# Patient Record
Sex: Male | Born: 1970
Health system: Southern US, Community
[De-identification: ages and names within clinical notes are randomized; demographics above are authoritative.]

## PROBLEM LIST (undated history)

## (undated) DIAGNOSIS — A159 Respiratory tuberculosis unspecified: Secondary | ICD-10-CM

## (undated) DIAGNOSIS — B20 Human immunodeficiency virus [HIV] disease: Secondary | ICD-10-CM

## (undated) DIAGNOSIS — Z21 Asymptomatic human immunodeficiency virus [HIV] infection status: Secondary | ICD-10-CM

## (undated) HISTORY — PX: HERNIA REPAIR: SHX51

## (undated) HISTORY — PX: TONSILLECTOMY: SUR1361

## (undated) HISTORY — DX: Respiratory tuberculosis unspecified: A15.9

---

## 2016-02-18 ENCOUNTER — Encounter (HOSPITAL_COMMUNITY): Payer: Self-pay | Admitting: Emergency Medicine

## 2016-02-18 ENCOUNTER — Emergency Department (HOSPITAL_COMMUNITY)
Admission: EM | Admit: 2016-02-18 | Discharge: 2016-02-18 | Disposition: A | Discharge status: Home / Self Care | Payer: Self-pay | Attending: Emergency Medicine | Admitting: Emergency Medicine

## 2016-02-18 DIAGNOSIS — Z880 Allergy status to penicillin: Secondary | ICD-10-CM | POA: Insufficient documentation

## 2016-02-18 DIAGNOSIS — B079 Viral wart, unspecified: Secondary | ICD-10-CM | POA: Insufficient documentation

## 2016-02-18 DIAGNOSIS — F17200 Nicotine dependence, unspecified, uncomplicated: Secondary | ICD-10-CM | POA: Insufficient documentation

## 2016-02-18 DIAGNOSIS — L84 Corns and callosities: Secondary | ICD-10-CM | POA: Insufficient documentation

## 2016-02-18 DIAGNOSIS — M7989 Other specified soft tissue disorders: Secondary | ICD-10-CM | POA: Insufficient documentation

## 2016-02-18 MED ORDER — NAPROXEN 500 MG PO TABS
500.0000 mg | ORAL_TABLET | Freq: Once | ORAL | Status: AC
  Administered 2016-02-18: 500 mg via ORAL
  Filled 2016-02-18: qty 1

## 2016-02-18 MED ORDER — NAPROXEN 500 MG PO TABS: 500.0000 mg | ORAL_TABLET | Freq: Two times a day (BID) | ORAL | Status: DC

## 2016-02-18 NOTE — ED Notes (Signed)
Pt states he put some callous cream on his hand a few days ago. On his right pointer finger. Pt also has a tight ring on that finger. Pt's finger is now swollen and his ring will not come off his finger

## 2016-02-18 NOTE — ED Provider Notes (Signed)
CSN: [id number]     Arrival date & time 02/18/16  0055 History   By signing my name below, I, Arlan Organshley Leger, attest that this documentation has been prepared under the direction and in the presence of Alp Goldwater, MD.  Electronically Signed: Arlan OrganAshley Leger, ED Scribe. 02/18/2016. 3:55 AM.   Chief Complaint  Patient presents with  . Finger Injury   Patient is a 45 y.o. male presenting with hand pain. The history is provided by the patient. No language interpreter was used.  Hand Pain This is a new problem. The current episode started more than 1 week ago. The problem occurs constantly. The problem has been gradually improving. Pertinent negatives include no chest pain and no abdominal pain. Nothing aggravates the symptoms. Nothing relieves the symptoms. Treatments tried: cutting ring of right index finger. The treatment provided significant relief.    HPI Comments: Geralynn Rilehillip Mella, R hand dominent is a 45 y.o. male without any pertinent past medical history who presents to the Emergency Department complaining of constant, ongoing swelling to the R pointer finger x 3 months. Pt states he has had a gold ring on his finger since December of 2016 and has been unable to remove it. OTC callous cream applied to finger without any improvement. No recent fever or chills. No numbness, loss of sensation, or weakness to finger. Pt with known allergies to penicillins.  PCP: No primary care provider on file.    History reviewed. No pertinent past medical history. Past Surgical History  Procedure Laterality Date  . Tonsillectomy    . Hernia repair     No family history on file. Social History  Substance Use Topics  . Smoking status: Current Every Day Smoker -- 0.05 packs/day  . Smokeless tobacco: None  . Alcohol Use: Yes     Comment: 4/week    Review of Systems  Constitutional: Negative for fever and chills.  Cardiovascular: Negative for chest pain.  Gastrointestinal: Negative for nausea,  vomiting and abdominal pain.  Musculoskeletal: Positive for joint swelling and arthralgias.  Neurological: Negative for weakness and numbness.  Psychiatric/Behavioral: Negative for confusion.  All other systems reviewed and are negative.     Allergies  Penicillins  Home Medications   Prior to Admission medications   Not on File   Triage Vitals: BP 138/99 mmHg  Pulse 102  Temp(Src) 98 F (36.7 C) (Oral)  Resp 18  Ht 5\' 9"  (1.753 m)  Wt 185 lb (83.915 kg)  BMI 27.31 kg/m2  SpO2 99%   Physical Exam  Constitutional: He is oriented to person, place, and time. He appears well-developed and well-nourished.  HENT:  Head: Normocephalic and atraumatic.  Mouth/Throat: Oropharynx is clear and moist. No oropharyngeal exudate.  Eyes: EOM are normal.  Neck: Normal range of motion.  Cardiovascular: Normal rate, regular rhythm, normal heart sounds and intact distal pulses.   No murmur heard. Pulmonary/Chest: Effort normal and breath sounds normal. No respiratory distress. He has no wheezes. He has no rales.  Abdominal: Soft. Bowel sounds are normal. He exhibits no distension. There is no tenderness. There is no rebound and no guarding.  Musculoskeletal: Normal range of motion.       Right hand: He exhibits normal two-point discrimination, normal capillary refill, no deformity and no laceration. Normal sensation noted. Normal strength noted.  Callous and small wart noted to right PIP palmar aspect Capillary refill less than 2 seconds   Neurological: He is alert and oriented to person, place, and time.  R  finger is N/V intact  Skin: Skin is warm and dry.  Psychiatric: He has a normal mood and affect. Judgment normal.  Nursing note and vitals reviewed.   ED Course  Procedures (including critical care time)  DIAGNOSTIC STUDIES: Oxygen Saturation is 99% on RA, Normal by my interpretation.    COORDINATION OF CARE: 3:48 AM- Will give Naprosyn. Discussed treatment plan with pt at  bedside and pt agreed to plan.     Labs Review Labs Reviewed - No data to display  Imaging Review No results found. I have personally reviewed and evaluated these images and lab results as part of my medical decision-making.   EKG Interpretation None      MDM   Final diagnoses:  None   Ring cut off finger.  Do not use the "callous cream" on your finger.  NSAIDs ice and elevation to decrease swelling.  No signs of infection of the tissue.  Follow up with your PMD for ongoing care   I personally performed the services described in this documentation, which was scribed in my presence. The recorded information has been reviewed and is accurate.     Cy Blamer, MD 02/18/16 [phone]

## 2018-02-25 ENCOUNTER — Emergency Department (HOSPITAL_BASED_OUTPATIENT_CLINIC_OR_DEPARTMENT_OTHER): Payer: Self-pay

## 2018-02-25 ENCOUNTER — Encounter (HOSPITAL_BASED_OUTPATIENT_CLINIC_OR_DEPARTMENT_OTHER): Payer: Self-pay

## 2018-02-25 ENCOUNTER — Emergency Department (HOSPITAL_BASED_OUTPATIENT_CLINIC_OR_DEPARTMENT_OTHER)
Admission: EM | Admit: 2018-02-25 | Discharge: 2018-02-26 | Disposition: A | Discharge status: Home / Self Care | Payer: Self-pay | Attending: Emergency Medicine | Admitting: Emergency Medicine

## 2018-02-25 ENCOUNTER — Encounter (HOSPITAL_COMMUNITY): Payer: Self-pay

## 2018-02-25 ENCOUNTER — Other Ambulatory Visit: Payer: Self-pay

## 2018-02-25 ENCOUNTER — Emergency Department (HOSPITAL_COMMUNITY)
Admission: EM | Admit: 2018-02-25 | Discharge: 2018-02-25 | Disposition: A | Discharge status: Home / Self Care | Payer: Managed Care, Other (non HMO) | Attending: Emergency Medicine | Admitting: Emergency Medicine

## 2018-02-25 DIAGNOSIS — J111 Influenza due to unidentified influenza virus with other respiratory manifestations: Secondary | ICD-10-CM | POA: Insufficient documentation

## 2018-02-25 DIAGNOSIS — R112 Nausea with vomiting, unspecified: Secondary | ICD-10-CM

## 2018-02-25 DIAGNOSIS — Z830 Family history of human immunodeficiency virus [HIV] disease: Secondary | ICD-10-CM

## 2018-02-25 DIAGNOSIS — R6889 Other general symptoms and signs: Secondary | ICD-10-CM

## 2018-02-25 DIAGNOSIS — Z5321 Procedure and treatment not carried out due to patient leaving prior to being seen by health care provider: Secondary | ICD-10-CM | POA: Insufficient documentation

## 2018-02-25 DIAGNOSIS — M791 Myalgia, unspecified site: Secondary | ICD-10-CM | POA: Insufficient documentation

## 2018-02-25 DIAGNOSIS — J181 Lobar pneumonia, unspecified organism: Secondary | ICD-10-CM | POA: Insufficient documentation

## 2018-02-25 DIAGNOSIS — R1110 Vomiting, unspecified: Secondary | ICD-10-CM | POA: Insufficient documentation

## 2018-02-25 DIAGNOSIS — J189 Pneumonia, unspecified organism: Secondary | ICD-10-CM

## 2018-02-25 DIAGNOSIS — R0981 Nasal congestion: Secondary | ICD-10-CM | POA: Insufficient documentation

## 2018-02-25 HISTORY — DX: Human immunodeficiency virus (HIV) disease: B20

## 2018-02-25 HISTORY — DX: Asymptomatic human immunodeficiency virus (hiv) infection status: Z21

## 2018-02-25 LAB — URINALYSIS, ROUTINE W REFLEX MICROSCOPIC
BACTERIA UA: NONE SEEN
Bilirubin Urine: NEGATIVE
Glucose, UA: NEGATIVE mg/dL
Ketones, ur: NEGATIVE mg/dL
Leukocytes, UA: NEGATIVE
Nitrite: NEGATIVE
PH: 5 (ref 5.0–8.0)
Protein, ur: NEGATIVE mg/dL
SPECIFIC GRAVITY, URINE: 1.014 (ref 1.005–1.030)
SQUAMOUS EPITHELIAL / LPF: NONE SEEN

## 2018-02-25 LAB — COMPREHENSIVE METABOLIC PANEL
ALBUMIN: 3.6 g/dL (ref 3.5–5.0)
ALT: 31 U/L (ref 17–63)
AST: 34 U/L (ref 15–41)
Alkaline Phosphatase: 75 U/L (ref 38–126)
Anion gap: 10 (ref 5–15)
BILIRUBIN TOTAL: 0.2 mg/dL — AB (ref 0.3–1.2)
BUN: 7 mg/dL (ref 6–20)
CHLORIDE: 103 mmol/L (ref 101–111)
CO2: 20 mmol/L — ABNORMAL LOW (ref 22–32)
CREATININE: 0.7 mg/dL (ref 0.61–1.24)
Calcium: 8.6 mg/dL — ABNORMAL LOW (ref 8.9–10.3)
GFR calc Af Amer: >60 mL/min (ref >60)
GLUCOSE: 106 mg/dL — AB (ref 65–99)
Potassium: 3.9 mmol/L (ref 3.5–5.1)
Sodium: 133 mmol/L — ABNORMAL LOW (ref 135–145)
Total Protein: 9.3 g/dL — ABNORMAL HIGH (ref 6.5–8.1)

## 2018-02-25 LAB — CBC
HEMATOCRIT: 47.5 % (ref 39.0–52.0)
Hemoglobin: 16.2 g/dL (ref 13.0–17.0)
MCH: 31.3 pg (ref 26.0–34.0)
MCHC: 34.1 g/dL (ref 30.0–36.0)
MCV: 91.7 fL (ref 78.0–100.0)
PLATELETS: 142 10*3/uL — AB (ref 150–400)
RBC: 5.18 MIL/uL (ref 4.22–5.81)
RDW: 13.6 % (ref 11.5–15.5)
WBC: 12.7 10*3/uL — ABNORMAL HIGH (ref 4.0–10.5)

## 2018-02-25 LAB — LIPASE, BLOOD: Lipase: 30 U/L (ref 11–51)

## 2018-02-25 MED ORDER — IBUPROFEN 800 MG PO TABS: 800.0000 mg | ORAL_TABLET | Freq: Three times a day (TID) | ORAL | 0 refills | Status: DC | PRN

## 2018-02-25 MED ORDER — SODIUM CHLORIDE 0.9 % IV BOLUS
2000.0000 mL | Freq: Once | INTRAVENOUS | Status: AC
  Administered 2018-02-25: 2000 mL via INTRAVENOUS

## 2018-02-25 MED ORDER — ACETAMINOPHEN 500 MG PO TABS
1000.0000 mg | ORAL_TABLET | Freq: Once | ORAL | Status: AC
  Administered 2018-02-25: 1000 mg via ORAL
  Filled 2018-02-25: qty 2

## 2018-02-25 MED ORDER — OSELTAMIVIR PHOSPHATE 75 MG PO CAPS: 75.0000 mg | ORAL_CAPSULE | Freq: Two times a day (BID) | ORAL | 0 refills | Status: DC

## 2018-02-25 MED ORDER — ONDANSETRON 4 MG PO TBDP: 4.0000 mg | ORAL_TABLET | Freq: Three times a day (TID) | ORAL | 0 refills | Status: DC | PRN

## 2018-02-25 MED ORDER — ONDANSETRON HCL 4 MG/2ML IJ SOLN
4.0000 mg | Freq: Once | INTRAMUSCULAR | Status: AC
  Administered 2018-02-25: 4 mg via INTRAVENOUS
  Filled 2018-02-25: qty 2

## 2018-02-25 NOTE — Discharge Instructions (Addendum)
You have been seen in the Emergency Department (ED) today for pneumonia.  Please drink plenty of clear fluids (water, Gatorade, chicken broth, etc).  You may use Tylenol and/or Motrin according to label instructions.  You can alternate between the two without any side effects.   You need to establish care with an infectious disease doctor ASAP. I have placed an ambulatory referral in the medical record to assist with this appointment but you will need to call and confirm the appointment date/time.  Please follow up with your doctor as listed above.  Call your doctor or return to the Emergency Department (ED) if you are unable to tolerate fluids due to vomiting, have worsening trouble breathing, become extremely tired or difficult to awaken, or if you develop any other symptoms that concern you.

## 2018-02-25 NOTE — ED Triage Notes (Signed)
Pt c/o cough and congestion for three days with vomiting and fever that started today, last dose of any OTC medication was ibuprofen at 1100 and last episode of vomiting was at 2000

## 2018-02-25 NOTE — ED Triage Notes (Signed)
Patient c/o cough, congestion, body aches x3 days and vomiting today. Denies diarrhea.

## 2018-02-25 NOTE — ED Provider Notes (Signed)
Emergency Department Provider Note   I have reviewed the triage vital signs and the nursing notes.   HISTORY  Chief Complaint Flu-Like Symptoms   HPI Chad [name] is a 47 y.o. male with PMH of HIV not on treatment or under ID care to the emergency department with flulike illness over the past 3-4 days including sinus congestion, sore throat, and face pressure.  He has experienced some mild cough that is non-productive.  He has been taking Motrin over-the-counter and today tried a hot-toddy and shortly after began having nausea and vomiting.  He denies any sick contacts.  He has a primary care physician in [place] but cannot recall the last time he is seen in.  He states that his last CD4 counts were "good" and performed at [facility]. He is not currently following with ID locally and has never been on anti-retrovirals since his diagnosis in 2004.    Past Medical History:  Diagnosis Date  . HIV (human immunodeficiency virus infection) (HCC)     There are no active problems to display for this patient.   Past Surgical History:  Procedure Laterality Date  . HERNIA REPAIR    . TONSILLECTOMY        Allergies Penicillins  No family history on file.  Social History Social History   Tobacco Use  . Smoking status: Current Every Day Smoker    Packs/day: 0.05  Substance Use Topics  . Alcohol use: Yes    Comment: 4/week  . Drug use: Not on file    Review of Systems  Constitutional: Positive fever/chills Eyes: No visual changes. ENT: No sore throat. Cardiovascular: Denies chest pain. Respiratory: Denies shortness of breath. Positive cough.  Gastrointestinal: No abdominal pain. Positive nausea and vomiting.  No diarrhea.  No constipation. Genitourinary: Negative for dysuria. Musculoskeletal: Negative for back pain. Skin: Negative for rash. Neurological: Negative for focal weakness or numbness. Positive mild HA.   10-point ROS otherwise  negative.  ____________________________________________   PHYSICAL EXAM:  VITAL SIGNS: ED Triage Vitals  Enc Vitals Group     BP 02/25/18 2227 (!) 158/91     Pulse Rate 02/25/18 2227 (!) 122     Resp 02/25/18 2227 (!) 24     Temp 02/25/18 2227 99 F (37.2 C)     Temp Source 02/25/18 2227 Oral     SpO2 02/25/18 2227 95 %     Weight 02/25/18 2227 180 lb (81.6 kg)     Height 02/25/18 2227 5\' 9"  (1.753 m)     Pain Score 02/25/18 2231 8   Constitutional: Alert and oriented. Well appearing and in no acute distress. Eyes: Conjunctivae are normal.  Head: Atraumatic. Nose: No congestion/rhinnorhea. Mouth/Throat: Mucous membranes are slightly dry.  Neck: No stridor.  No meningeal signs.  Cardiovascular: Sinus tachycardia. Good peripheral circulation. Grossly normal heart sounds.   Respiratory: Normal respiratory effort.  No retractions. Lungs CTAB. Gastrointestinal: Soft and nontender. No distention.  Musculoskeletal: No lower extremity tenderness nor edema. No gross deformities of extremities. Neurologic:  Normal speech and language. No gross focal neurologic deficits are appreciated.  Skin:  Skin is warm, dry and intact. No rash noted.  ____________________________________________   LABS (all labs ordered are listed, but only abnormal results are displayed)  Labs Reviewed  T-HELPER CELLS (CD4) COUNT (NOT AT [facility]) - Abnormal; Notable for the following components:      Result Value   CD4 % Helper T Cell 29 (*)    All other components within  normal limits  CULTURE, BLOOD (ROUTINE X 2)  CULTURE, BLOOD (ROUTINE X 2)   ____________________________________________  RADIOLOGY  Dg Chest 2 View  Result Date: 02/25/2018 CLINICAL DATA:  Flu like symptoms EXAM: CHEST - 2 VIEW COMPARISON:  None. FINDINGS: Hyperinflation. No focal consolidation or pleural effusion. Coarse perihilar interstitial opacity. Normal heart size. No pneumothorax. Slightly stellate configuration of the left  hilus. IMPRESSION: No focal pulmonary infiltrate. Coarse perihilar interstitial opacity, possible viral process. Somewhat stellate appearance to the left hilus, consider contrast-enhanced chest CT to exclude a mass in the region. Electronically Signed   By: Jasmine PangKim  Fujinaga M.D.   On: 02/25/2018 23:44   Ct Chest W Contrast  Result Date: 02/26/2018 CLINICAL DATA:  47 year old male with cough and congestion. EXAM: CT CHEST WITH CONTRAST TECHNIQUE: Multidetector CT imaging of the chest was performed during intravenous contrast administration. CONTRAST:  80mL ISOVUE-300 IOPAMIDOL (ISOVUE-300) INJECTION 61% COMPARISON:  Chest radiograph dated 02/25/2018 FINDINGS: Cardiovascular: There is no cardiomegaly or pericardial effusion. The thoracic aorta is unremarkable. The origins of the great vessels of the aortic arch are patent. There is no CT evidence of central pulmonary embolism. Evaluation of the peripheral branches is limited due to suboptimal opacification. Mediastinum/Nodes: Mildly enlarged right hilar lymph node measures 11 mm short axis, reactive. The esophagus and the thyroid gland are grossly unremarkable. No mediastinal fluid collection. Lungs/Pleura: Patchy nodular and ground-glass densities involving the right lower lobe as well as clusters of nodular densities in the upper lobes most consistent with pneumonia. Clinical correlation is recommended. Linear subpleural atelectasis/scarring predominantly involving the upper lobes as well as small biapical subpleural blebs. There is mild bronchiectatic changes of the left lower lobe. There is no pleural effusion or pneumothorax. The central airways are patent. Upper Abdomen: No acute abnormality. Musculoskeletal: No chest wall abnormality. No acute or significant osseous findings. IMPRESSION: 1. No CT evidence of central pulmonary artery embolus. 2. Nodular densities predominantly in the upper lobes as well as areas of ground-glass nodularity in the right lower lobe  most consistent with pneumonia. Clinical correlation is recommended. 3. Chronic changes of mild bronchiectasis and subpleural scarring. Electronically Signed   By: Elgie CollardArash  Radparvar M.D.   On: 02/26/2018 00:53    ____________________________________________   PROCEDURES  Procedure(s) performed:   Procedures  None ____________________________________________   INITIAL IMPRESSION / ASSESSMENT AND PLAN / ED COURSE  Pertinent labs & imaging results that were available during my care of the patient were reviewed by me and considered in my medical decision making (see chart for details).  Patient with past medical history of HIV, not under ID treatment and with last CD4 in 2011, presents to the emergency department with flulike illness with fever today along with one episode of vomiting.  His upper respiratory tract infection symptoms have been present for 3-4 days and manage primarily with Motrin at home.  Patient is tachycardic here with tachypnea.  No hypoxemia.  Abdomen is soft and nontender.  Risk stratification in this HIV patient is difficult given his lack of recent follow-up recent CD4.  [facility] than left after a Hinata Diener wait in the waiting room.  He has a leukocytosis but no other significant findings on those labs.  I will add blood cultures and send a CD4 count although this will not be back during the patient's ED visit.  Plan for aggressive IV fluid hydration along with Zofran and PO challenge.  Plan to obtain chest x-ray  to rule out pneumonia.   Labs and CXR reviewed. CD4 count pending. ID referral replaced. CT chest pending. Care transferred to Dr. Judd Lien.  ____________________________________________  FINAL CLINICAL IMPRESSION(S) / ED DIAGNOSES  Final diagnoses:  Flu-like symptoms  Non-intractable vomiting with nausea, unspecified vomiting type  Community acquired pneumonia of right lower lobe of lung (HCC)  Family  history of human immunodeficiency virus (hiv) disease     MEDICATIONS GIVEN DURING THIS VISIT:  Medications  sodium chloride 0.9 % bolus 2,000 mL (0 mLs Intravenous Stopped 02/26/18 0132)  ondansetron (ZOFRAN) injection 4 mg (4 mg Intravenous Given 02/25/18 2307)  acetaminophen (TYLENOL) tablet 1,000 mg (1,000 mg Oral Given 02/25/18 2310)  iopamidol (ISOVUE-300) 61 % injection 80 mL (80 mLs Intravenous Contrast Given 02/26/18 0012)     NEW OUTPATIENT MEDICATIONS STARTED DURING THIS VISIT:  Discharge Medication List as of 02/26/2018  1:05 AM    START taking these medications   Details  ibuprofen (ADVIL,MOTRIN) 800 MG tablet Take 1 tablet (800 mg total) by mouth every 8 (eight) hours as needed., Starting Mon 02/25/2018, Print    levofloxacin (LEVAQUIN) 750 MG tablet Take 1 tablet (750 mg total) by mouth daily. X 7 days, Starting Tue 02/26/2018, Print    ondansetron (ZOFRAN ODT) 4 MG disintegrating tablet Take 1 tablet (4 mg total) by mouth every 8 (eight) hours as needed for nausea or vomiting., Starting Mon 02/25/2018, Print    oseltamivir (TAMIFLU) 75 MG capsule Take 1 capsule (75 mg total) by mouth every 12 (twelve) hours., Starting Mon 02/25/2018, Print        Note:  This document was prepared using Dragon voice recognition software and may include unintentional dictation errors.  Alona Bene, MD Emergency Medicine    Jaaron Oleson, Arlyss Repress, MD 02/26/18 [phone]

## 2018-02-26 LAB — T-HELPER CELLS (CD4) COUNT (NOT AT ARMC)
CD4 T CELL ABS: 1020 /uL (ref 400–2700)
CD4 T CELL HELPER: 29 % — AB (ref 33–55)

## 2018-02-26 MED ORDER — LEVOFLOXACIN 750 MG PO TABS: 750.0000 mg | ORAL_TABLET | Freq: Every day | ORAL | 0 refills | Status: DC

## 2018-02-26 MED ORDER — IOPAMIDOL (ISOVUE-300) INJECTION 61%
80.0000 mL | Freq: Once | INTRAVENOUS | Status: AC | PRN
  Administered 2018-02-26: 80 mL via INTRAVENOUS

## 2018-02-26 NOTE — ED Provider Notes (Signed)
Patient's care assumed from Dr. Jacqulyn BathLong at shift change.  Patient with history of untreated HIV disease presenting with complaints of cough and fever for the past several days.  His chest x-ray showed abnormalities that radiologist recommended a chest CT to better define.  This chest CT was obtained and does show areas of groundglass nodularity in the right lower lobe most consistent with pneumonia.  He will be treated with Levaquin.  He has blood cultures and CD4 count pending.  The patient has been lost to infectious disease follow-up for 7 years since relocating to the area.  He will be given the follow-up information for the ID clinic.   Chad [name], Chad Hillenburg, MD 02/26/18 [phone]

## 2018-03-03 LAB — CULTURE, BLOOD (ROUTINE X 2)
Culture: NO GROWTH
Culture: NO GROWTH
Special Requests: ADEQUATE
Special Requests: ADEQUATE

## 2018-03-04 ENCOUNTER — Other Ambulatory Visit: Payer: Self-pay

## 2018-03-04 ENCOUNTER — Ambulatory Visit: Payer: Self-pay

## 2018-03-04 DIAGNOSIS — B20 Human immunodeficiency virus [HIV] disease: Secondary | ICD-10-CM

## 2018-03-04 DIAGNOSIS — Z79899 Other long term (current) drug therapy: Secondary | ICD-10-CM

## 2018-03-04 DIAGNOSIS — Z113 Encounter for screening for infections with a predominantly sexual mode of transmission: Secondary | ICD-10-CM

## 2018-03-05 LAB — FLUORESCENT TREPONEMAL AB(FTA)-IGG-BLD: FLUORESCENT TREPONEMAL ABS: REACTIVE — AB

## 2018-03-05 LAB — CBC WITH DIFFERENTIAL/PLATELET
BASOS ABS: 38 {cells}/uL (ref 0–200)
BASOS PCT: 0.5 %
EOS ABS: 90 {cells}/uL (ref 15–500)
Eosinophils Relative: 1.2 %
HEMATOCRIT: 42.2 % (ref 38.5–50.0)
Hemoglobin: 14.5 g/dL (ref 13.2–17.1)
Lymphs Abs: 4800 cells/uL — ABNORMAL HIGH (ref 850–3900)
MCH: 30.4 pg (ref 27.0–33.0)
MCHC: 34.4 g/dL (ref 32.0–36.0)
MCV: 88.5 fL (ref 80.0–100.0)
MPV: 11.3 fL (ref 7.5–12.5)
Monocytes Relative: 11.8 %
NEUTROS ABS: 1688 {cells}/uL (ref 1500–7800)
Neutrophils Relative %: 22.5 %
Platelets: 213 10*3/uL (ref 140–400)
RBC: 4.77 10*6/uL (ref 4.20–5.80)
RDW: 12.2 % (ref 11.0–15.0)
Total Lymphocyte: 64 %
WBC: 7.5 10*3/uL (ref 3.8–10.8)
WBCMIX: 885 {cells}/uL (ref 200–950)

## 2018-03-05 LAB — URINALYSIS
Bilirubin Urine: NEGATIVE
GLUCOSE, UA: NEGATIVE
HGB URINE DIPSTICK: NEGATIVE
Ketones, ur: NEGATIVE
LEUKOCYTES UA: NEGATIVE
Nitrite: NEGATIVE
Specific Gravity, Urine: 1.018 (ref 1.001–1.03)
pH: 6 (ref 5.0–8.0)

## 2018-03-05 LAB — COMPLETE METABOLIC PANEL WITH GFR
AG RATIO: 0.9 (calc) — AB (ref 1.0–2.5)
ALT: 22 U/L (ref 9–46)
AST: 28 U/L (ref 10–40)
Albumin: 4 g/dL (ref 3.6–5.1)
Alkaline phosphatase (APISO): 68 U/L (ref 40–115)
BUN: 10 mg/dL (ref 7–25)
CALCIUM: 8.9 mg/dL (ref 8.6–10.3)
CO2: 22 mmol/L (ref 20–32)
CREATININE: 0.8 mg/dL (ref 0.60–1.35)
Chloride: 103 mmol/L (ref 98–110)
GFR, EST AFRICAN AMERICAN: 125 mL/min/{1.73_m2} (ref >=60)
GFR, EST NON AFRICAN AMERICAN: 108 mL/min/{1.73_m2} (ref >=60)
GLOBULIN: 4.4 g/dL — AB (ref 1.9–3.7)
Glucose, Bld: 89 mg/dL (ref 65–99)
Potassium: 4.1 mmol/L (ref 3.5–5.3)
Sodium: 135 mmol/L (ref 135–146)
Total Bilirubin: 0.3 mg/dL (ref 0.2–1.2)
Total Protein: 8.4 g/dL — ABNORMAL HIGH (ref 6.1–8.1)

## 2018-03-05 LAB — RPR: RPR Ser Ql: REACTIVE — AB

## 2018-03-05 LAB — HEPATITIS C ANTIBODY
HEP C AB: NONREACTIVE
SIGNAL TO CUT-OFF: 0.16 (ref <1.00)

## 2018-03-05 LAB — QUANTIFERON-TB GOLD PLUS
Mitogen-NIL: >10 IU/mL
NIL: 0.2 IU/mL
QUANTIFERON-TB GOLD PLUS: NEGATIVE
TB1-NIL: 0.14 [IU]/mL
TB2-NIL: 0.09 IU/mL

## 2018-03-05 LAB — LIPID PANEL
CHOL/HDL RATIO: 3.6 (calc) (ref <5.0)
CHOLESTEROL: 129 mg/dL (ref <200)
HDL: 36 mg/dL — ABNORMAL LOW (ref >40)
LDL CHOLESTEROL (CALC): 77 mg/dL
NON-HDL CHOLESTEROL (CALC): 93 mg/dL (ref <130)
Triglycerides: 77 mg/dL (ref <150)

## 2018-03-05 LAB — HEPATITIS B SURFACE ANTIGEN: Hepatitis B Surface Ag: NONREACTIVE

## 2018-03-05 LAB — HEPATITIS A ANTIBODY, TOTAL: HEPATITIS A AB,TOTAL: NONREACTIVE

## 2018-03-05 LAB — T-HELPER CELL (CD4) - (RCID CLINIC ONLY)
CD4 % Helper T Cell: 27 % — ABNORMAL LOW (ref 33–55)
CD4 T CELL ABS: 1370 /uL (ref 400–2700)

## 2018-03-05 LAB — HEPATITIS B SURFACE ANTIBODY,QUALITATIVE: HEP B S AB: REACTIVE — AB

## 2018-03-05 LAB — RPR TITER

## 2018-03-05 LAB — URINE CYTOLOGY ANCILLARY ONLY
Chlamydia: NEGATIVE
NEISSERIA GONORRHEA: NEGATIVE

## 2018-03-05 LAB — HEPATITIS B CORE ANTIBODY, TOTAL: HEP B C TOTAL AB: REACTIVE — AB

## 2018-03-07 LAB — HLA B*5701: HLA-B 5701 W/RFLX HLA-B HIGH: NEGATIVE

## 2018-03-15 LAB — HIV-1 RNA ULTRAQUANT REFLEX TO GENTYP+
HIV 1 RNA QUANT: 1490 {copies}/mL — AB
HIV-1 RNA Quant, Log: 3.17 Log cps/mL — ABNORMAL HIGH

## 2018-03-15 LAB — RFLX HIV-1 INTEGRASE GENOTYPE: HIV-1 GENOTYPE: DETECTED — AB

## 2018-03-21 ENCOUNTER — Encounter: Payer: Self-pay | Admitting: Family

## 2018-03-21 ENCOUNTER — Ambulatory Visit: Payer: Self-pay

## 2018-04-08 DIAGNOSIS — B20 Human immunodeficiency virus [HIV] disease: Secondary | ICD-10-CM | POA: Insufficient documentation

## 2018-04-08 NOTE — Progress Notes (Signed)
Subjective:    Patient ID: Chad [name], male    DOB: January 09, 1971, 47 y.o.   MRN: [id number]  Chief Complaint  Patient presents with  . Establish Care  . HIV Positive/AIDS     HPI:  Chad [name] is a 47 y.o. male who presents today to establish care for his HIV disease.  Chad [name] was first diagnosed with HIV in 2004 when he was being treated for syphilis.. He has never been on antiretroviral therapy since he was initially diagnosed. He was most recently seen in the ED with the chief complaint of cough and fever for several days. CT scan showed ground-glass nodularity consistent with pneumonia. He was treated with levofloxacin and referred to ID. Lab work showed that his CD4 count was 1370 with a viral load of 1490. His RPR was reactive with a titer of 1:4. His Hepaittis B surface antigen was negative as his Hepatitis C antibody.  Denies fevers, chills, night sweats, headaches, changes in vision, neck pain/stiffness, nausea, diarrhea, vomiting, lesions or rashes.   No Known Allergies    Outpatient Medications Prior to Visit  Medication Sig Dispense Refill  . ibuprofen (ADVIL,MOTRIN) 800 MG tablet Take 1 tablet (800 mg total) by mouth every 8 (eight) hours as needed. 21 tablet 0  . levofloxacin (LEVAQUIN) 750 MG tablet Take 1 tablet (750 mg total) by mouth daily. X 7 days 7 tablet 0  . ondansetron (ZOFRAN ODT) 4 MG disintegrating tablet Take 1 tablet (4 mg total) by mouth every 8 (eight) hours as needed for nausea or vomiting. (Patient not taking: Reported on 04/09/2018) 20 tablet 0  . oseltamivir (TAMIFLU) 75 MG capsule Take 1 capsule (75 mg total) by mouth every 12 (twelve) hours. 10 capsule 0   No facility-administered medications prior to visit.      Past Medical History:  Diagnosis Date  . HIV (human immunodeficiency virus infection) ([place])   . Tuberculosis       Past Surgical History:  Procedure Laterality Date  . HERNIA REPAIR    . TONSILLECTOMY         Family History  Problem Relation Age of Onset  . Throat cancer Mother   . Aneurysm Maternal Grandmother       Social History   Socioeconomic History  . Marital status: Single    Spouse name: Not on file  . Number of children: 1  . Years of education: 55  . Highest education level: Bachelor's degree (e.g., BA, AB, BS)  Occupational History  . Not on file  Social Needs  . Financial resource strain: Not on file  . Food insecurity:    Worry: Not on file    Inability: Not on file  . Transportation needs:    Medical: Not on file    Non-medical: Not on file  Tobacco Use  . Smoking status: Current Every Day Smoker    Packs/day: 0.05    Years: 20.00    Pack years: 1.00  . Smokeless tobacco: Never Used  Substance and Sexual Activity  . Alcohol use: Yes    Alcohol/week: 6.0 - 8.4 oz    Types: 10 - 14 Cans of beer per week  . Drug use: Yes    Frequency: 2.0 times per week    Types: Marijuana  . Sexual activity: Not on file  Lifestyle  . Physical activity:    Days per week: Not on file    Minutes per session: Not on file  . Stress: Not on  file  Relationships  . Social connections:    Talks on phone: Not on file    Gets together: Not on file    Attends religious service: Not on file    Active member of club or organization: Not on file    Attends meetings of clubs or organizations: Not on file    Relationship status: Not on file  . Intimate partner violence:    Fear of current or ex partner: Not on file    Emotionally abused: Not on file    Physically abused: Not on file    Forced sexual activity: Not on file  Other Topics Concern  . Not on file  Social History Narrative  . Not on file      Review of Systems  Constitutional: Negative for activity change, appetite change, diaphoresis, fatigue, fever and unexpected weight change.  HENT: Negative for congestion, sinus pressure and sore throat.   Respiratory: Negative for cough, chest tightness, shortness  of breath and wheezing.   Cardiovascular: Negative for chest pain and leg swelling.  Gastrointestinal: Negative for abdominal pain, constipation, diarrhea, nausea and vomiting.  Genitourinary: Negative for dysuria, flank pain, frequency, genital sores, hematuria and urgency.  Neurological: Negative for weakness and headaches.       Objective:    BP (!) 139/91 (BP Location: Left Arm, Patient Position: Sitting, Cuff Size: Normal)   Pulse 98   Temp 97.7 F (36.5 C) (Oral)   Resp 18   Ht _0  (1.727 m)   Wt 182 lb (82.6 kg)   SpO2 100%   BMI 27.67 kg/m  Nursing note and vital signs reviewed.  Physical Exam  Constitutional: He is oriented to person, place, and time. He appears well-developed. No distress.  HENT:  Mouth/Throat: Oropharynx is clear and moist.  Eyes: Conjunctivae are normal.  Neck: Neck supple.  Cardiovascular: Normal rate, regular rhythm, normal heart sounds and intact distal pulses. Exam reveals no gallop and no friction rub.  No murmur heard. Pulmonary/Chest: Effort normal and breath sounds normal. No respiratory distress. He has no wheezes. He has no rales. He exhibits no tenderness.  Abdominal: Soft. Bowel sounds are normal. There is no tenderness.  Lymphadenopathy:    He has no cervical adenopathy.  Neurological: He is alert and oriented to person, place, and time.  Skin: Skin is warm and dry. No rash noted.  Psychiatric: His behavior is normal. Judgment and thought content normal. His mood appears anxious.        Assessment & Plan:   Problem List Items Addressed This Visit      Other   HIV disease [facility])    [name] was diagnosed with HIV in 2004 and has not been on any medication regimens since initial diagnosis. We discussed the risks associated with untreated virus and he is wishing to pursue medication. The plan is to start [place]. He also had an opportunity to meet with our pharmacy staff to discuss. Prevnar updated today. Will need to check  [id number] at next office visit with blood work. Continue to monitor RPR titer of 1:4 with previous treatment and no current symptoms. Lipid profile completed with no medication needed currently. Condoms offered. Plan to follow up in 1 month or sooner if needed.       Relevant Medications   pneumococcal 13-valent conjugate vaccine (PREVNAR 13) injection 0.5 mL (Completed)       I have discontinued Doren Custard [name]'s ondansetron, oseltamivir, and levofloxacin. I am also having him maintain  his ibuprofen. We administered pneumococcal 13-valent conjugate vaccine.   Meds ordered this encounter  Medications  . pneumococcal 13-valent conjugate vaccine (PREVNAR 13) injection 0.5 mL     Follow-up: Return in about 1 month (around 05/10/2018), or if symptoms worsen or fail to improve.   Mauricio Po, [place] [facility]

## 2018-04-09 ENCOUNTER — Ambulatory Visit (INDEPENDENT_AMBULATORY_CARE_PROVIDER_SITE_OTHER): Payer: Self-pay | Admitting: Licensed Clinical Social Worker

## 2018-04-09 ENCOUNTER — Ambulatory Visit (INDEPENDENT_AMBULATORY_CARE_PROVIDER_SITE_OTHER): Payer: Self-pay | Admitting: Family

## 2018-04-09 ENCOUNTER — Encounter: Payer: Self-pay | Admitting: Family

## 2018-04-09 ENCOUNTER — Ambulatory Visit (INDEPENDENT_AMBULATORY_CARE_PROVIDER_SITE_OTHER): Payer: Self-pay | Admitting: Pharmacist

## 2018-04-09 DIAGNOSIS — B20 Human immunodeficiency virus [HIV] disease: Secondary | ICD-10-CM

## 2018-04-09 DIAGNOSIS — R110 Nausea: Secondary | ICD-10-CM

## 2018-04-09 DIAGNOSIS — F4320 Adjustment disorder, unspecified: Secondary | ICD-10-CM

## 2018-04-09 MED ORDER — PNEUMOCOCCAL 13-VAL CONJ VACC IM SUSP
0.5000 mL | Freq: Once | INTRAMUSCULAR | Status: AC
  Administered 2018-04-09: 0.5 mL via INTRAMUSCULAR

## 2018-04-09 MED ORDER — ONDANSETRON 4 MG PO TBDP: 4.0000 mg | ORAL_TABLET | Freq: Three times a day (TID) | ORAL | 0 refills | Status: DC | PRN

## 2018-04-09 MED ORDER — BICTEGRAVIR-EMTRICITAB-TENOFOV 50-200-25 MG PO TABS: 1.0000 | ORAL_TABLET | Freq: Every day | ORAL | 5 refills | Status: DC

## 2018-04-09 MED FILL — ONDANSETRON ODT 4 MG TABLET: 4 | 7 days supply | Qty: 20 | Fill #0

## 2018-04-09 MED FILL — BIKTARVY 50-200-25 MG TABS: 50-200-25 | 30 days supply | Qty: 30 | Fill #0

## 2018-04-09 NOTE — Progress Notes (Signed)
HPI: Chad [name] is a 47 y.o. male who presents to the clinic today to initiate care for his HIV infection.   Allergies: No Known Allergies  Vitals: Temp: 97.7 F (36.5 C) (05/14 0923) Temp Source: Oral (05/14 0923) BP: 139/91 (05/14 0923) Pulse Rate: 98 (05/14 1610)  Past Medical History: Past Medical History:  Diagnosis Date  . HIV (human immunodeficiency virus infection) (HCC)   . Tuberculosis     Social History: Social History   Socioeconomic History  . Marital status: Single    Spouse name: Not on file  . Number of children: 1  . Years of education: 8  . Highest education level: Bachelor's degree (e.g., BA, AB, BS)  Occupational History  . Not on file  Social Needs  . Financial resource strain: Not on file  . Food insecurity:    Worry: Not on file    Inability: Not on file  . Transportation needs:    Medical: Not on file    Non-medical: Not on file  Tobacco Use  . Smoking status: Current Every Day Smoker    Packs/day: 0.05    Years: 20.00    Pack years: 1.00  . Smokeless tobacco: Never Used  Substance and Sexual Activity  . Alcohol use: Yes    Alcohol/week: 6.0 - 8.4 oz    Types: 10 - 14 Cans of beer per week  . Drug use: Yes    Frequency: 2.0 times per week    Types: Marijuana  . Sexual activity: Not on file  Lifestyle  . Physical activity:    Days per week: Not on file    Minutes per session: Not on file  . Stress: Not on file  Relationships  . Social connections:    Talks on phone: Not on file    Gets together: Not on file    Attends religious service: Not on file    Active member of club or organization: Not on file    Attends meetings of clubs or organizations: Not on file    Relationship status: Not on file  Other Topics Concern  . Not on file  Social History Narrative  . Not on file    Labs: HIV 1 RNA Quant (Copies/mL)  Date Value  03/04/2018 1,490 (H)   CD4 T Cell Abs (/uL)  Date Value  03/04/2018 1,370  02/25/2018  1,020   Hep B S Ab (no units)  Date Value  03/04/2018 REACTIVE (A)   Hepatitis B Surface Ag (no units)  Date Value  03/04/2018 NON-REACTIVE    CrCl: CrCl cannot be calculated (Patient's most recent lab result is older than the maximum 21 days allowed.).  Lipids:    Component Value Date/Time   CHOL 129 03/04/2018 1043   TRIG 77 03/04/2018 1043   HDL 36 (L) 03/04/2018 1043   CHOLHDL 3.6 03/04/2018 1043   LDLCALC 77 03/04/2018 1043    Assessment: Chad [name] presents to see Chad [name] today to initiate treatment for his HIV infection. He has had HIV for about 15 years but has never started therapy. After discussion, he is ready to start medications today. He does have an HIV positive partner who is controlled on medications. They recently moved in together.   He will be started on Biktarvy. I counseled him on administration, side effects and the importance of adherence to the medication. Chad [name] was worried about potentially having nausea so we will send in a prescription for Zofran. He also takes a daily multivitamin in the  morning so I instructed him to take this medication at night.  He will be utilizing the [facility] for his medications.   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 We will get him set up with [employer] for now for his Biktarvy. He will bring in his pay stubs later today. His work does have a re-enrollment period in June and he plans to look into insurance options at that time. He will pick up his first month of Biktarvy today and take his first tablet tonight.   Recommendations: - Take Biktarvy every day -  Bring 2 pay-stubs from the last 90 days to the clinic  - F/U at the clinic in 1 month   Chad [name], PharmD, BCPS Clinical Infectious Disease Pharmacy Resident  [facility] 04/09/2018, 10:21 AM

## 2018-04-09 NOTE — Assessment & Plan Note (Signed)
Chad [name] was diagnosed with HIV in 2004 and has not been on any medication regimens since initial diagnosis. We discussed the risks associated with untreated virus and he is wishing to pursue medication. The plan is to start [place]. He also had an opportunity to meet with our pharmacy staff to discuss. Prevnar updated today. Will need to check [id number] at next office visit with blood work. Continue to monitor RPR titer of 1:4 with previous treatment and no current symptoms. Lipid profile completed with no medication needed currently. Condoms offered. Plan to follow up in 1 month or sooner if needed.

## 2018-04-09 NOTE — BH Specialist Note (Signed)
Integrated Behavioral Health Initial Visit  MRN: [id number] Name: Chad [name]  Number of Integrated Behavioral Health Clinician visits:: 1/6 Session Start time: 10:35am Session End time: 10:50am Total time: 15 minutes  Type of Service: Integrated Behavioral Health- Individual/Family Interpretor:No. Interpretor Name and Language: n/a   Warm Hand Off Completed.       SUBJECTIVE: Chad [name] is a 47 y.o. male accompanied by self Patient was referred by Marcos Eke for history of depression.  OBJECTIVE: Mood: Euthymic and Affect: Constricted Risk of harm to self or others: No plan to harm self or others  LIFE CONTEXT: Patient reports he moved here from [place] 2 years ago, has a good healthy relationship and is raising his daughter. When he moved he did not know where to seek treatment for HIV, and therefore was on no medications for a while. Recently he contracted the flu and was directed to follow up at [facility]. Currently he states that he is not experiencing depressive symptoms, but that he has dealt with depression in the past. 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    GOALS ADDRESSED: Patient will: 1. Increase knowledge and/or ability of: healthy habits and coping skills   INTERVENTIONS: Interventions utilized: Sleep Hygiene and Psychoeducation and/or Health Education   ASSESSMENT: Patient reports currently experiencing only the occasional situational sadness, such as missing his mother on mother's day. He reports past struggles with depressed mood but does not seem to arise to the diagnosis of major depression. Most appropriate diagnosis would be Adjustment Disorder as his struggles are reportedly situational. Counselor provided patient with information about counseling services and explored with him wellness skills. Counselor encouraged patient to develop a daily maintenance program to deal  with stressors.   Patient may benefit from counseling services as needed.  PLAN:  1. Behavioral recommendations: develop routine and wellness maintenance skills.    Angus Palms, LCSW

## 2018-04-09 NOTE — Patient Instructions (Signed)
Very nice to meet you!  We will get you started on Biktarvy.   You have received your Prevnar vaccination today. We will plan to get you the second pneumonia vaccination in about 3 months.  Please let us know if you have any questions or concerns.  We will see you back in 1 month.

## 2018-04-23 ENCOUNTER — Telehealth: Payer: Self-pay | Admitting: Pharmacist

## 2018-04-23 NOTE — Telephone Encounter (Signed)
Patient was supposed to send me his paystubs to get [place] approval for [name] until he gets insurance.  I have called patient at least 5-6 times to send them to me and he has yet to return my call or send them.

## 2018-04-29 ENCOUNTER — Encounter: Payer: Self-pay | Admitting: *Deleted

## 2018-04-30 ENCOUNTER — Telehealth: Payer: Self-pay | Admitting: Pharmacist

## 2018-04-30 NOTE — Telephone Encounter (Signed)
Awesome thank you!

## 2018-04-30 NOTE — Telephone Encounter (Signed)
Patient is approved to get [place] through [employer] patient assistance until 04/09/19.

## 2018-05-06 MED FILL — BIKTARVY 50-200-25 MG TABS: 50-200-25 | 30 days supply | Qty: 30 | Fill #1

## 2018-05-09 ENCOUNTER — Encounter: Payer: Self-pay | Admitting: Family

## 2018-05-09 ENCOUNTER — Ambulatory Visit (INDEPENDENT_AMBULATORY_CARE_PROVIDER_SITE_OTHER): Payer: Self-pay | Admitting: Family

## 2018-05-09 VITALS — BP 143/100 | HR 87 | Temp 97.9°F | Ht 69.0 in | Wt 187.0 lb

## 2018-05-09 DIAGNOSIS — B20 Human immunodeficiency virus [HIV] disease: Secondary | ICD-10-CM

## 2018-05-09 DIAGNOSIS — R030 Elevated blood-pressure reading, without diagnosis of hypertension: Secondary | ICD-10-CM

## 2018-05-09 DIAGNOSIS — Z23 Encounter for immunization: Secondary | ICD-10-CM

## 2018-05-09 NOTE — Assessment & Plan Note (Signed)
Blood pressure elevated today with no neurological or cardiovascular symptoms. Encouraged to monitor blood pressure at home and follow a low sodium diet. If blood pressure remains elevated will need to consider medication.

## 2018-05-09 NOTE — Assessment & Plan Note (Signed)
[name] appears to be improved since starting his medication and has been adherent to his medication regimen with no missed doses. He has no side effects. No signs/symptoms of opportunistic infection through history or physical exam. He is eating well. He has a good support system and continues to work at a stable job. Meningococcal updated today. Continue current dosage of Biktarvy. We will check his CD4 count and viral load. Plan to follow up in 3 months or sooner if needed.

## 2018-05-09 NOTE — Patient Instructions (Signed)
Nice to see you!  Keep taking your medication as prescribed.  We will recheck your viral load and CD4 count today.  Keep us posted about job changes - we can make it work.  Please ensure Cassie has your paystubs.

## 2018-05-09 NOTE — Progress Notes (Signed)
Subjective:    Patient ID: Chad [name], male    DOB: [birth year], 47 y.o.   MRN: [id number]  Chief Complaint  Patient presents with  . HIV Positive/AIDS    HPI:  Chad Rilehillip Deyarmin is a 47 y.o. male who presents today for routine follow up of HIV disease.  Mr. Roxan HockeyRobinson returns today having previously been seen in May 2019 and newly started on Biktarvy. He reports taking the medications as prescribed and denies adverse side effects. Has no problems obtaining or taking his medications. 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 Denies fevers, chills, night sweats, headaches, changes in vision, neck pain/stiffness, nausea, diarrhea, vomiting, lesions or rashes. Eating is improved. There is a chance he may be temporarily relocating to [place].    Allergies  Allergen Reactions  . Penicillins Other (See Comments)    Other Reaction: mild hives in remote past      Outpatient Medications Prior to Visit  Medication Sig Dispense Refill  . bictegravir-emtricitabine-tenofovir AF (BIKTARVY) 50-200-25 MG TABS tablet Take 1 tablet by mouth daily. 30 tablet 5  . ibuprofen (ADVIL,MOTRIN) 800 MG tablet Take 1 tablet (800 mg total) by mouth every 8 (eight) hours as needed. 21 tablet 0  . ondansetron (ZOFRAN ODT) 4 MG disintegrating tablet Take 1 tablet (4 mg total) by mouth every 8 (eight) hours as needed for nausea or vomiting. 20 tablet 0   No facility-administered medications prior to visit.      Past Medical History:  Diagnosis Date  . HIV (human immunodeficiency virus infection) (HCC)   . Tuberculosis      Past Surgical History:  Procedure Laterality Date  . HERNIA REPAIR    . TONSILLECTOMY         Review of Systems  Constitutional: Negative for activity change, appetite change, diaphoresis, fatigue, fever and unexpected weight change.  HENT: Negative for congestion, sinus pressure and sore throat.   Respiratory: Negative for cough, chest tightness, shortness  of breath and wheezing.   Cardiovascular: Negative for chest pain and leg swelling.  Gastrointestinal: Negative for abdominal pain, constipation, diarrhea, nausea and vomiting.  Genitourinary: Negative for dysuria, flank pain, frequency, genital sores, hematuria and urgency.  Neurological: Negative for weakness and headaches.      Objective:    BP (!) 143/100   Pulse 87   Temp 97.9 F (36.6 C) (Oral)   Ht 5\' 9"  (1.753 m)   Wt 187 lb (84.8 kg)   BMI 27.62 kg/m  Nursing note and vital signs reviewed.  Physical Exam  Constitutional: He is oriented to person, place, and time. He appears well-developed. No distress.  HENT:  Mouth/Throat: Oropharynx is clear and moist.  Eyes: Conjunctivae are normal.  Neck: Neck supple.  Cardiovascular: Normal rate, regular rhythm, normal heart sounds and intact distal pulses. Exam reveals no gallop and no friction rub.  No murmur heard. Pulmonary/Chest: Effort normal and breath sounds normal. No respiratory distress. He has no wheezes. He has no rales. He exhibits no tenderness.  Abdominal: Soft. Bowel sounds are normal. There is no tenderness.  Lymphadenopathy:    He has no cervical adenopathy.  Neurological: He is alert and oriented to person, place, and time.  Skin: Skin is warm and dry. No rash noted.  Psychiatric: He has a normal mood and affect. His behavior is normal. Judgment and thought content normal.       Assessment & Plan:   Problem List Items Addressed This Visit  Other   HIV disease (HCC) - Primary    Mr. Lanigan appears to be improved since starting his medication and has been adherent to his medication regimen with no missed doses. He has no side effects. No signs/symptoms of opportunistic infection through history or physical exam. He is eating well. He has a good support system and continues to work at a stable job. Meningococcal updated today. Continue current dosage of Biktarvy. We will check his CD4 count and viral  load. Plan to follow up in 3 months or sooner if needed.       Relevant Orders   T-helper cell (CD4)- ([facility] clinic only)   HIV 1 RNA quant-no reflex-bld   MENINGOCOCCAL MCV4O(MENVEO)   Elevated blood pressure reading    Blood pressure elevated today with no neurological or cardiovascular symptoms. Encouraged to monitor blood pressure at home and follow a low sodium diet. If blood pressure remains elevated will need to consider medication.           I am having Chad Rile maintain his ibuprofen, bictegravir-emtricitabine-tenofovir AF, and ondansetron.   Follow-up: Return in about 3 months (around 08/09/2018).   Marcos Eke, MSN, [facility]

## 2018-05-10 LAB — T-HELPER CELL (CD4) - (RCID CLINIC ONLY)
CD4 T CELL ABS: 1240 /uL (ref 400–2700)
CD4 T CELL HELPER: 28 % — AB (ref 33–55)

## 2018-05-14 LAB — HIV-1 RNA QUANT-NO REFLEX-BLD
HIV 1 RNA QUANT: DETECTED {copies}/mL — AB
HIV-1 RNA Quant, Log: <1.3 Log copies/mL — AB

## 2018-05-17 ENCOUNTER — Telehealth: Payer: Self-pay | Admitting: Behavioral Health

## 2018-05-17 NOTE — Telephone Encounter (Signed)
-----   Message from Veryl SpeakGregory D Calone, FNP sent at 05/17/2018  7:30 AM EDT ----- Please inform patient that his viral load is now undetectable and his CD4 count remains excellent at 1,240. Plan to follow up in 3 months as discussed.

## 2018-05-17 NOTE — Telephone Encounter (Signed)
-----   Message from Gregory D Calone, FNP sent at 05/17/2018  7:30 AM EDT ----- Please inform patient that his viral load is now undetectable and his CD4 count remains excellent at 1,240. Plan to follow up in 3 months as discussed. 

## 2018-05-17 NOTE — Telephone Encounter (Signed)
Called [name], informed him per Marcos EkeGreg Calone NP that his Viral Load is now undetectable and his CD count remains excellent at 1240.  Patient was appreciative and verbalized understanding.  Patient mentioned there is a possibility he may be moving  To [place] next year and we talked about what would need to be done to transfer care in the future. Angeline SlimAshley Davona Kinoshita RN

## 2018-06-10 MED FILL — BIKTARVY 50-200-25 MG TABS: 50-200-25 | 30 days supply | Qty: 30 | Fill #2

## 2018-07-08 MED FILL — BIKTARVY 50-200-25 MG TABS: 50-200-25 | 30 days supply | Qty: 30 | Fill #3

## 2018-07-10 ENCOUNTER — Telehealth: Payer: Self-pay | Admitting: Behavioral Health

## 2018-07-10 ENCOUNTER — Telehealth: Payer: Self-pay

## 2018-07-10 ENCOUNTER — Ambulatory Visit: Payer: Self-pay

## 2018-07-10 NOTE — Telephone Encounter (Signed)
-----   Message from Chad SpeakGregory D Calone, FNP sent at 07/10/2018 10:50 AM EDT ----- Yes Chad [name] is fine. It is longer than 8 weeks apart.   Thanks for checking! ----- Message ----- From: Chad [name], Chad Georg, Chad [name] Sent: 0/[id number]/14/2019   8:42 AM EDT To: Chad SpeakGregory D Calone, FNP  Chad [name] is coming in today for an injection.  It does not specify   It appears he is coming in for a second Chad [name] but there is not documentation to verify this.  Is this ok?

## 2018-07-10 NOTE — Telephone Encounter (Signed)
Called patient and left a generic message to call his doctor's office back at [phone].  Patient has a nurse visit today for a Menveo injection , the facility is currently waiting on the vaccines to arrive.  Was calling patient to reschedule his nurse visit. Angeline SlimAshley Angelisa Winthrop RN

## 2018-07-10 NOTE — Telephone Encounter (Signed)
Patient has an appointment coming up on 07/15/18. Verbal order per Marcos EkeGreg Calone NP to give Menvo 2 of 2.   Santa LighterShaquenia S, LPN

## 2018-07-15 ENCOUNTER — Other Ambulatory Visit: Payer: Self-pay

## 2018-07-15 DIAGNOSIS — B20 Human immunodeficiency virus [HIV] disease: Secondary | ICD-10-CM

## 2018-07-15 DIAGNOSIS — Z79899 Other long term (current) drug therapy: Secondary | ICD-10-CM

## 2018-07-17 ENCOUNTER — Other Ambulatory Visit: Payer: Self-pay

## 2018-07-17 DIAGNOSIS — Z79899 Other long term (current) drug therapy: Secondary | ICD-10-CM

## 2018-07-17 DIAGNOSIS — B20 Human immunodeficiency virus [HIV] disease: Secondary | ICD-10-CM

## 2018-07-18 LAB — T-HELPER CELL (CD4) - (RCID CLINIC ONLY)
CD4 T CELL HELPER: 30 % — AB (ref 33–55)
CD4 T Cell Abs: 1572 /uL (ref 400–2700)

## 2018-07-19 LAB — CBC WITH DIFFERENTIAL/PLATELET
BASOS ABS: 72 {cells}/uL (ref 0–200)
BASOS PCT: 0.9 %
EOS ABS: 168 {cells}/uL (ref 15–500)
Eosinophils Relative: 2.1 %
HCT: 45 % (ref 38.5–50.0)
HEMOGLOBIN: 15.4 g/dL (ref 13.2–17.1)
Lymphs Abs: 5408 cells/uL — ABNORMAL HIGH (ref 850–3900)
MCH: 31.6 pg (ref 27.0–33.0)
MCHC: 34.2 g/dL (ref 32.0–36.0)
MCV: 92.2 fL (ref 80.0–100.0)
MPV: 11 fL (ref 7.5–12.5)
Monocytes Relative: 7.2 %
NEUTROS ABS: 1776 {cells}/uL (ref 1500–7800)
Neutrophils Relative %: 22.2 %
Platelets: 198 10*3/uL (ref 140–400)
RBC: 4.88 10*6/uL (ref 4.20–5.80)
RDW: 13.1 % (ref 11.0–15.0)
TOTAL LYMPHOCYTE: 67.6 %
WBC: 8 10*3/uL (ref 3.8–10.8)
WBCMIX: 576 {cells}/uL (ref 200–950)

## 2018-07-19 LAB — LIPID PANEL
CHOL/HDL RATIO: 3.2 (calc) (ref <5.0)
CHOLESTEROL: 148 mg/dL (ref <200)
HDL: 46 mg/dL (ref >40)
LDL CHOLESTEROL (CALC): 69 mg/dL
Non-HDL Cholesterol (Calc): 102 mg/dL (calc) (ref <130)
Triglycerides: 249 mg/dL — ABNORMAL HIGH (ref <150)

## 2018-07-19 LAB — COMPLETE METABOLIC PANEL WITH GFR
AG Ratio: 0.8 (calc) — ABNORMAL LOW (ref 1.0–2.5)
ALBUMIN MSPROF: 3.7 g/dL (ref 3.6–5.1)
ALT: 23 U/L (ref 9–46)
AST: 36 U/L (ref 10–40)
Alkaline phosphatase (APISO): 71 U/L (ref 40–115)
BUN: 11 mg/dL (ref 7–25)
CO2: 24 mmol/L (ref 20–32)
Calcium: 9 mg/dL (ref 8.6–10.3)
Chloride: 103 mmol/L (ref 98–110)
Creat: 0.9 mg/dL (ref 0.60–1.35)
GFR, EST AFRICAN AMERICAN: 119 mL/min/{1.73_m2} (ref >=60)
GFR, EST NON AFRICAN AMERICAN: 103 mL/min/{1.73_m2} (ref >=60)
GLUCOSE: 111 mg/dL — AB (ref 65–99)
Globulin: 4.9 g/dL (calc) — ABNORMAL HIGH (ref 1.9–3.7)
Potassium: 4.1 mmol/L (ref 3.5–5.3)
Sodium: 136 mmol/L (ref 135–146)
TOTAL PROTEIN: 8.6 g/dL — AB (ref 6.1–8.1)
Total Bilirubin: 0.4 mg/dL (ref 0.2–1.2)

## 2018-07-19 LAB — HIV-1 RNA QUANT-NO REFLEX-BLD
HIV 1 RNA Quant: <20 copies/mL
HIV-1 RNA Quant, Log: <1.3 Log copies/mL
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
## 2018-07-30 ENCOUNTER — Encounter: Payer: Self-pay | Admitting: Family

## 2018-07-30 ENCOUNTER — Ambulatory Visit (INDEPENDENT_AMBULATORY_CARE_PROVIDER_SITE_OTHER): Payer: Self-pay | Admitting: Family

## 2018-07-30 VITALS — BP 163/107 | HR 90 | Temp 98.1°F | Ht 69.0 in | Wt 188.8 lb

## 2018-07-30 DIAGNOSIS — I10 Essential (primary) hypertension: Secondary | ICD-10-CM | POA: Insufficient documentation

## 2018-07-30 DIAGNOSIS — B20 Human immunodeficiency virus [HIV] disease: Secondary | ICD-10-CM

## 2018-07-30 DIAGNOSIS — Z23 Encounter for immunization: Secondary | ICD-10-CM

## 2018-07-30 MED ORDER — AMLODIPINE BESYLATE 10 MG PO TABS: 10.0000 mg | ORAL_TABLET | Freq: Every day | ORAL | 11 refills | Status: DC

## 2018-07-30 MED ORDER — BICTEGRAVIR-EMTRICITAB-TENOFOV 50-200-25 MG PO TABS: 1.0000 | ORAL_TABLET | Freq: Every day | ORAL | 5 refills | Status: DC

## 2018-07-30 NOTE — Patient Instructions (Signed)
Nice to see you.  Please continue to take your [place] as prescribed.  A prescription for amlodipine was sent to your pharmacy for your blood pressure.   Continue to monitor your blood pressure at home.  Plan for a nurse visit in 2 weeks to recheck your blood pressure.  Follow up office visit in 3 months with blood work 1-2 weeks prior to your appointment.

## 2018-07-30 NOTE — Assessment & Plan Note (Signed)
Chad [name] has well-controlled HIV disease with a viral load that is undetectable and CD4 count of 1500.  He is adherent to his Biktarvy with no adverse side effects.  He has no problems obtaining or taking his medications.  No signs/symptoms of opportunistic infection/progressive HIV disease through history or physical exam.  Influenza and second meningococcal vaccination given today.  Continue current dose of Biktarvy.  Follow-up office visit in 3 months or sooner if needed with blood work 1 to 2 weeks prior to appointment.

## 2018-07-30 NOTE — Assessment & Plan Note (Signed)
Chad [name] continues to have elevated blood pressure readings above 140/90 consistent with hypertension which is most likely exacerbated by his increased levels of stress the last 1 month.  Discussed importance of blood pressure control to reduce risk for heart failure, stroke, and cardiovascular disease in the future which are exponentially increased with HIV disease eating well controlled.  Start amlodipine.  Encouraged to monitor blood pressure at home and decrease carbohydrate intake.  He has no current symptoms of endorgan damage on physical exam.  Follow-up in 2 weeks for nurse visit to recheck blood pressure.

## 2018-08-13 MED FILL — BIKTARVY 50-200-25 MG TABS: 50-200-25 | 30 days supply | Qty: 30 | Fill #4

## 2018-08-23 IMAGING — CT CT CHEST W/ CM
2 of 4 series · 15 of 36 positions shown, 18 images · IV contrast (iopamidol)
Comparison: Chest radiograph dated 02/25/2018

CLINICAL DATA: 46-year-old male with cough and congestion.

EXAM:
CT CHEST WITH CONTRAST
TECHNIQUE: Multidetector CT imaging of the chest was performed during
intravenous contrast administration.
CONTRAST:  80mL [id number] IOPAMIDOL ([id number]) INJECTION 61%

[Series 2: axial st · axial · 0.78mm/px · z∈[-241,+27]mm · 12 of 160 slices shown, 15 images]
[im 13/160  mediastinal]
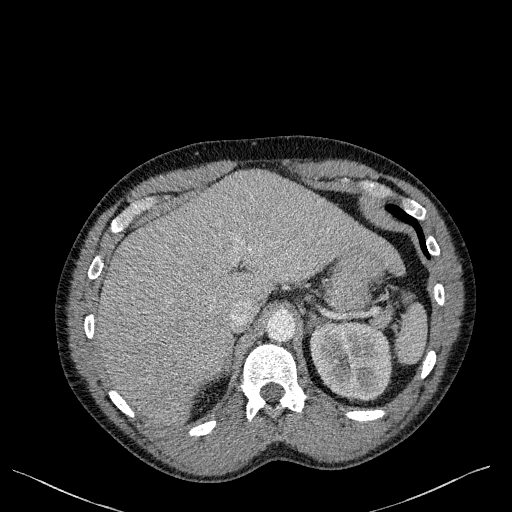
[im 13/160  lung]
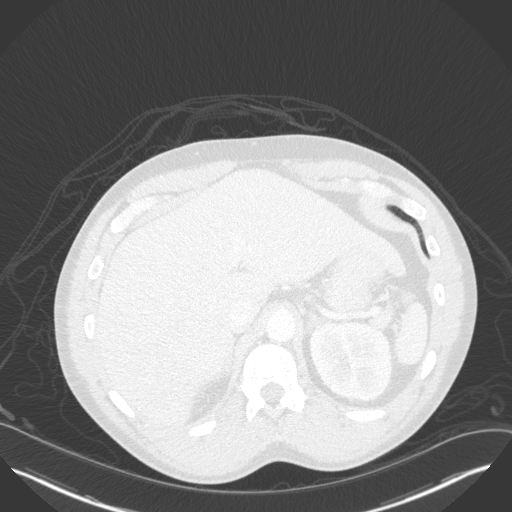
[im 25/160  lung]
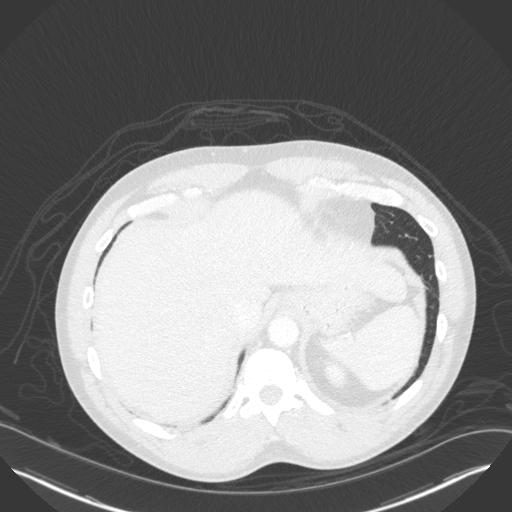
[im 37/160  lung]
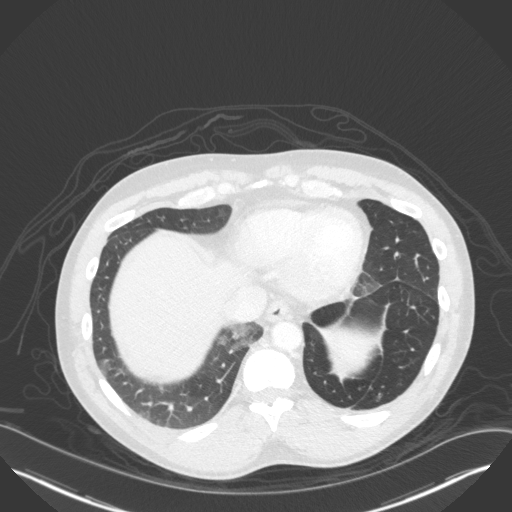
[im 49/160  lung]
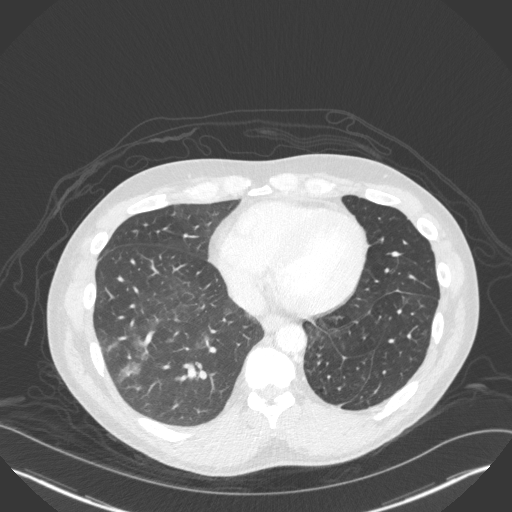
[im 62/160  mediastinal]
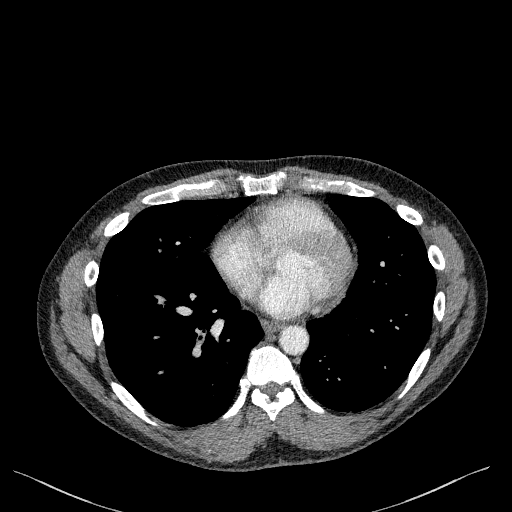
[im 62/160  lung]
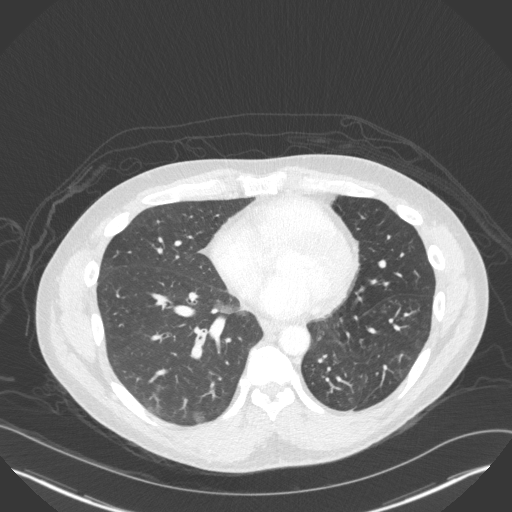
[im 74/160  lung]
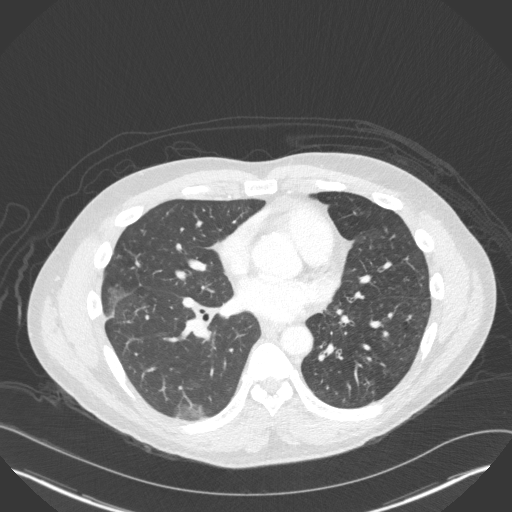
[im 86/160  lung]
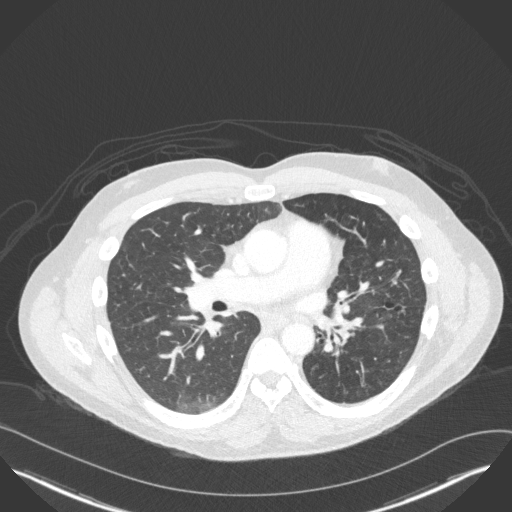
[im 98/160  lung]
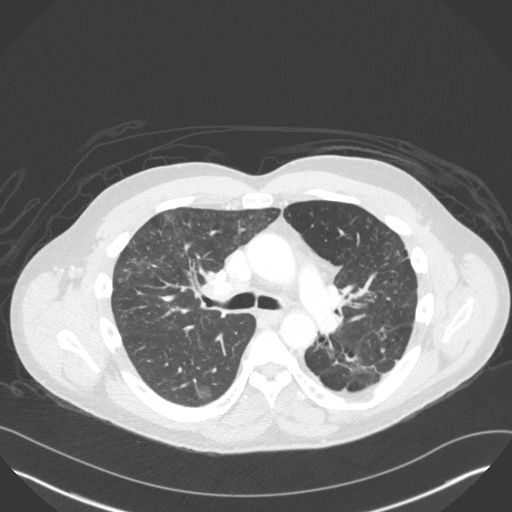
[im 111/160  mediastinal]
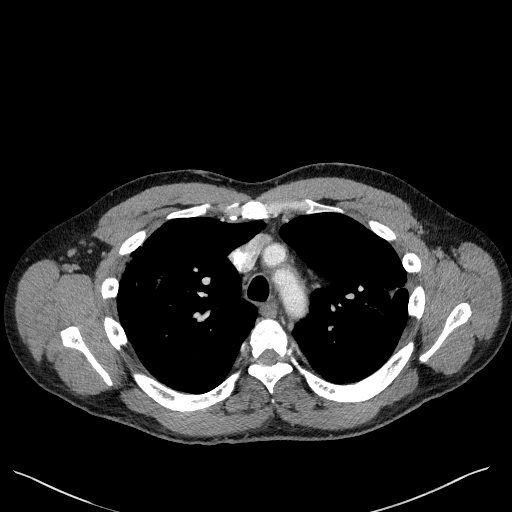
[im 111/160  lung]
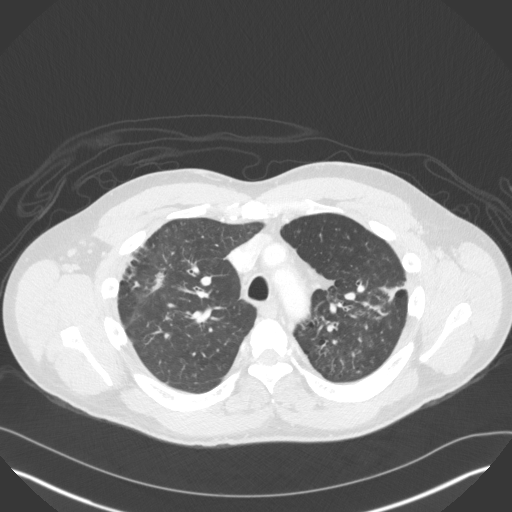
[im 123/160  lung]
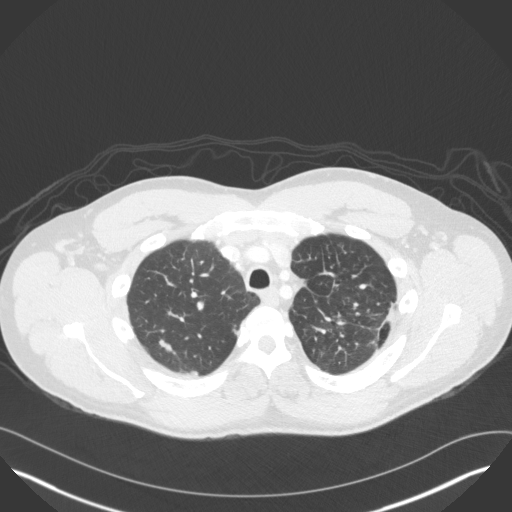
[im 135/160  lung]
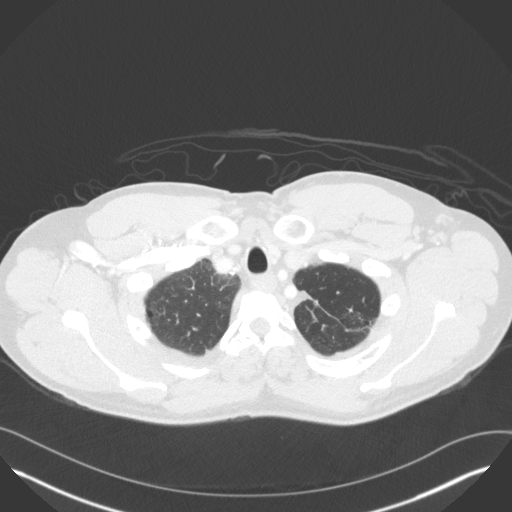
[im 147/160  lung]
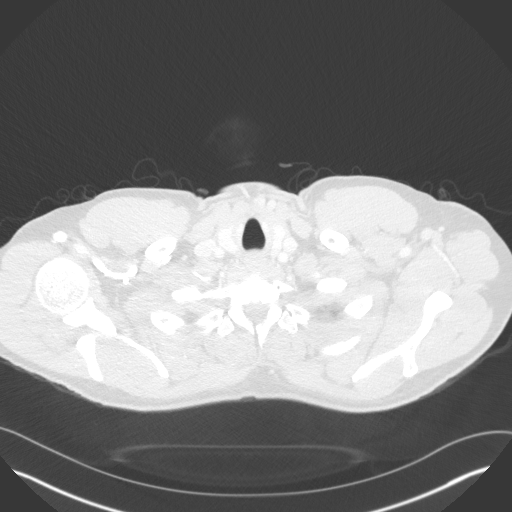

[Series 5: coronal · coronal · 0.73mm/px · 3 of 113 slices shown]
[im 23/113  lung]
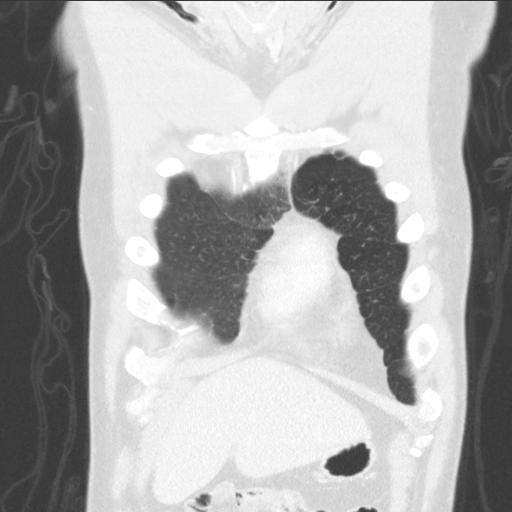
[im 45/113  lung]
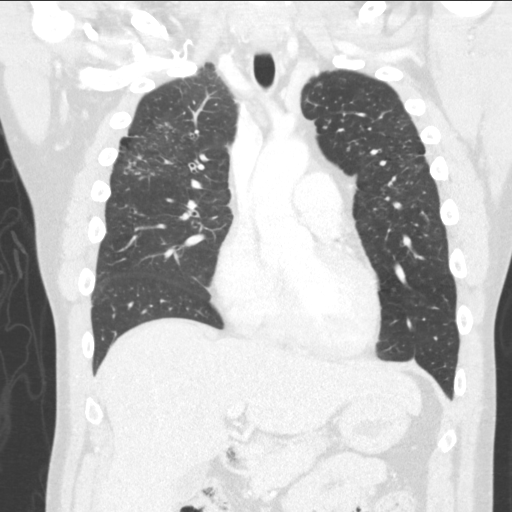
[im 68/113  lung]
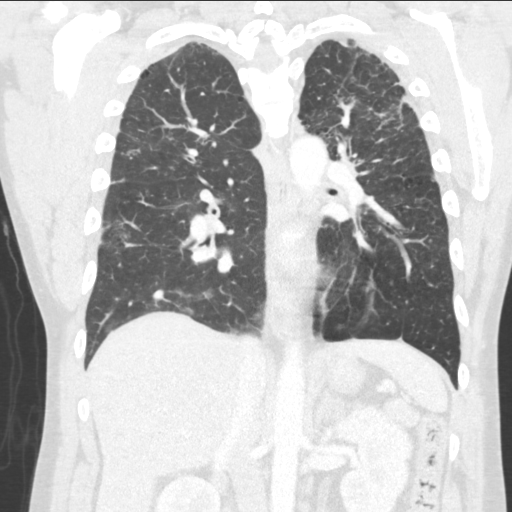

[15 of 36 positions shown; findings below may reference images not displayed]

FINDINGS: Cardiovascular: There is no cardiomegaly or pericardial effusion.
The thoracic aorta is unremarkable. The origins of the great vessels
of the aortic arch are patent. There is no CT evidence of central
pulmonary embolism. Evaluation of the peripheral branches is limited
due to suboptimal opacification.

Mediastinum/Nodes: Mildly enlarged right hilar lymph node measures
11 mm short axis, reactive. The esophagus and the thyroid gland are
grossly unremarkable. No mediastinal fluid collection.

Lungs/Pleura: Patchy nodular and ground-glass densities involving
the right lower lobe as well as clusters of nodular densities in the
upper lobes most consistent with pneumonia. Clinical correlation is
recommended. Linear subpleural atelectasis/scarring predominantly
involving the upper lobes as well as small biapical subpleural
blebs. There is mild bronchiectatic changes of the left lower lobe.
There is no pleural effusion or pneumothorax. The central airways
are patent.

Upper Abdomen: No acute abnormality.

Musculoskeletal: No chest wall abnormality. No acute or significant
osseous findings.
IMPRESSION: 1. No CT evidence of central pulmonary artery embolus.
2. Nodular densities predominantly in the upper lobes as well as
areas of ground-glass nodularity in the right lower lobe most
consistent with pneumonia. Clinical correlation is recommended.
3. Chronic changes of mild bronchiectasis and subpleural scarring.

## 2018-09-13 MED FILL — BIKTARVY 50-200-25 MG TABS: 50-200-25 | 30 days supply | Qty: 30 | Fill #5

## 2018-10-04 ENCOUNTER — Other Ambulatory Visit: Payer: Self-pay | Admitting: Pharmacist

## 2018-10-04 DIAGNOSIS — B20 Human immunodeficiency virus [HIV] disease: Secondary | ICD-10-CM

## 2018-10-10 ENCOUNTER — Other Ambulatory Visit: Payer: Self-pay

## 2018-10-10 DIAGNOSIS — B20 Human immunodeficiency virus [HIV] disease: Secondary | ICD-10-CM

## 2018-10-10 DIAGNOSIS — I10 Essential (primary) hypertension: Secondary | ICD-10-CM

## 2018-10-10 MED FILL — BIKTARVY 50-200-25 MG TABS: 50-200-25 | 30 days supply | Qty: 30 | Fill #0

## 2018-10-14 ENCOUNTER — Other Ambulatory Visit: Payer: Self-pay

## 2018-10-14 LAB — BASIC METABOLIC PANEL
BUN: 9 mg/dL (ref 7–25)
CALCIUM: 9 mg/dL (ref 8.6–10.3)
CO2: 26 mmol/L (ref 20–32)
Chloride: 102 mmol/L (ref 98–110)
Creat: 0.87 mg/dL (ref 0.60–1.35)
Glucose, Bld: 88 mg/dL (ref 65–99)
Potassium: 3.7 mmol/L (ref 3.5–5.3)
SODIUM: 136 mmol/L (ref 135–146)

## 2018-10-14 LAB — HIV-1 RNA QUANT-NO REFLEX-BLD
HIV 1 RNA QUANT: DETECTED {copies}/mL — AB
HIV-1 RNA Quant, Log: <1.3 Log copies/mL — AB

## 2018-10-28 ENCOUNTER — Ambulatory Visit (INDEPENDENT_AMBULATORY_CARE_PROVIDER_SITE_OTHER): Payer: Self-pay | Admitting: Family

## 2018-10-28 ENCOUNTER — Encounter: Payer: Self-pay | Admitting: Family

## 2018-10-28 VITALS — BP 131/87 | HR 89 | Temp 98.0°F | Ht 69.0 in | Wt 196.0 lb

## 2018-10-28 DIAGNOSIS — Z23 Encounter for immunization: Secondary | ICD-10-CM

## 2018-10-28 DIAGNOSIS — Z0000 Encounter for general adult medical examination without abnormal findings: Secondary | ICD-10-CM | POA: Insufficient documentation

## 2018-10-28 DIAGNOSIS — B20 Human immunodeficiency virus [HIV] disease: Secondary | ICD-10-CM

## 2018-10-28 DIAGNOSIS — Z720 Tobacco use: Secondary | ICD-10-CM | POA: Insufficient documentation

## 2018-10-28 DIAGNOSIS — I10 Essential (primary) hypertension: Secondary | ICD-10-CM

## 2018-10-28 NOTE — Assessment & Plan Note (Signed)
[name] has well controlled HIV disease with his current ART regimen of Biktarvy with good adherence and no adverse side effects. He has no problems obtaining his medications. No signs/symptoms of opportunistic or progressive HIV disease at present. Socially he is going through a lot of stressors between relationships and work. Declines meeting with our counselor today. Continue current dose of Biktarvy. Follow up office visit in 3 months or sooner if need with lab work 1-2 week prior to appointment.

## 2018-10-28 NOTE — Patient Instructions (Addendum)
Good to see you.  Keep up the great work you are getting healthy!  Continue to take your Biktarvy and amlodipine.   We will get you scheduled for our dentist.   We are here to help!  Plan for follow up in 3 months or sooner if needed with lab work 1-2 weeks prior to your appointment.  Have a healthy and happy holiday!!!

## 2018-10-28 NOTE — Assessment & Plan Note (Signed)
Continues to smoke about 1/2 pack per day. He is in the contemplation stage of quitting but socially and emotionally going through a lot of stressors right now. Discussed importance of having support resources available and being ready. Discussed the continued risk for cardiovascular, respiratory, or malignant disease in the future with continued tobacco and nicotine usage.

## 2018-10-28 NOTE — Progress Notes (Signed)
Subjective:    Patient ID: Chad [name], male    DOB: 16-Jun-1971, 47 y.o.   MRN: [id number]  Chief Complaint  Patient presents with  . HIV Positive/AIDS  . Hypertension     HPI:  Chad [name] is a 47 y.o. male who presents today for routine follow up of his HIV disease and hypertension.  1.) HIV disease - Chad [name] was last seen in the office on 07/30/18 for routine follow up and found to have a viral load that remained undetectable and CD4 count of 1500 with his regimen of Biktarvy. Most recent blood work completed on 10/10/18 shows a viral load that remains undetectable and kidney function and electrolytes within the normal ranges. Health maintenance due includes Pneumovax and dental screening.   Chad [name] has been taking his Biktarvy as prescribed with only 1 missed dose since his previous office visit. He has no adverse side effects and has no problems obtaining his medication. Socially he has been having a tough time as he is recently out of a relationship and is having some troubles at work. He is working his way through things and has a brother with whom he lives and is very supportive. He does continue to smoke about 1/2 pack per day.   Denies fevers, chills, night sweats, headaches, changes in vision, neck pain/stiffness, nausea, diarrhea, vomiting, lesions or rashes.   2.) Hypertension - Chad [name] has been taking his amlodipine as prescribed with no adverse side effects or lower extremity swelling. Does not currently take his blood pressure at home. Denies headaches, blurred or changed vision, chest pain or shortness of breath.   BP Readings from Last 3 Encounters:  10/28/18 131/87  07/30/18 (!) 163/107  05/09/18 (!) 143/100    Allergies  Allergen Reactions  . Penicillins Other (See Comments)    Other Reaction: mild hives in remote past      Outpatient Medications Prior to Visit  Medication Sig Dispense Refill  . amLODipine (NORVASC) 10 MG tablet  Take 1 tablet (10 mg total) by mouth daily. 30 tablet 11  . bictegravir-emtricitabine-tenofovir AF (BIKTARVY) 50-200-25 MG TABS tablet Take 1 tablet by mouth daily. 30 tablet 5  . BIKTARVY 50-200-25 MG TABS tablet TAKE 1 TABLET BY MOUTH DAILY. 30 tablet 5  . ibuprofen (ADVIL,MOTRIN) 800 MG tablet Take 1 tablet (800 mg total) by mouth every 8 (eight) hours as needed. 21 tablet 0  . ondansetron (ZOFRAN ODT) 4 MG disintegrating tablet Take 1 tablet (4 mg total) by mouth every 8 (eight) hours as needed for nausea or vomiting. 20 tablet 0   No facility-administered medications prior to visit.      Past Medical History:  Diagnosis Date  . HIV (human immunodeficiency virus infection) (HCC)   . Tuberculosis      Past Surgical History:  Procedure Laterality Date  . HERNIA REPAIR    . TONSILLECTOMY       Review of Systems  Constitutional: Negative for appetite change, chills, fatigue, fever and unexpected weight change.  Eyes: Negative for visual disturbance.  Respiratory: Negative for cough, chest tightness, shortness of breath and wheezing.   Cardiovascular: Negative for chest pain and leg swelling.  Gastrointestinal: Negative for abdominal pain, constipation, diarrhea, nausea and vomiting.  Genitourinary: Negative for dysuria, flank pain, frequency, genital sores, hematuria and urgency.  Skin: Negative for rash.  Allergic/Immunologic: Negative for immunocompromised state.  Neurological: Negative for dizziness and headaches.      Objective:    BP 131/87  Pulse 89   Temp 98 F (36.7 C)   Ht 5\' 9"  (1.753 m)   Wt 196 lb (88.9 kg)   BMI 28.94 kg/m  Nursing note and vital signs reviewed.  Physical Exam  Constitutional: He is oriented to person, place, and time. He appears well-developed. No distress.  HENT:  Mouth/Throat: Oropharynx is clear and moist.  Eyes: Conjunctivae are normal.  Neck: Neck supple.  Cardiovascular: Normal rate, regular rhythm, normal heart sounds and  intact distal pulses. Exam reveals no gallop and no friction rub.  No murmur heard. Pulmonary/Chest: Effort normal and breath sounds normal. No respiratory distress. He has no wheezes. He has no rales. He exhibits no tenderness.  Abdominal: Soft. Bowel sounds are normal. There is no tenderness.  Lymphadenopathy:    He has no cervical adenopathy.  Neurological: He is alert and oriented to person, place, and time.  Skin: Skin is warm and dry. No rash noted.  Psychiatric: He has a normal mood and affect. His behavior is normal. Judgment and thought content normal.       Assessment & Plan:   Problem List Items Addressed This Visit      Cardiovascular and Mediastinum   Essential hypertension    Mr. Eplin's blood pressure is significantly improved from previous and tolerating medication with no adverse side effects. He has no current symptoms of end organ damage or headaches. Encouraged to monitor his blood pressure at home as able. Continue current dose of amlodipine.         Other   HIV disease (HCC) - Primary    Mr. Aliano has well controlled HIV disease with his current ART regimen of Biktarvy with good adherence and no adverse side effects. He has no problems obtaining his medications. No signs/symptoms of opportunistic or progressive HIV disease at present. Socially he is going through a lot of stressors between relationships and work. Declines meeting with our counselor today. Continue current dose of Biktarvy. Follow up office visit in 3 months or sooner if need with lab work 1-2 week prior to appointment.       Relevant Orders   T-helper cell (CD4)- ([facility] clinic only)   HIV-1 RNA quant-no reflex-bld   CBC   Comprehensive metabolic panel   Lipid panel   RPR   Pneumococcal polysaccharide vaccine 23-valent greater than or equal to 2yo subcutaneous/IM (Completed)   Healthcare maintenance     Pneumovax updated today.  Due for dental screening with referral to [facility] dental  clinic placed.       Tobacco use    Continues to smoke about 1/2 pack per day. He is in the contemplation stage of quitting but socially and emotionally going through a lot of stressors right now. Discussed importance of having support resources available and being ready. Discussed the continued risk for cardiovascular, respiratory, or malignant disease in the future with continued tobacco and nicotine usage.       Other Visit Diagnoses    Need for 23-polyvalent pneumococcal polysaccharide vaccine       Relevant Orders   Pneumococcal polysaccharide vaccine 23-valent greater than or equal to 2yo subcutaneous/IM (Completed)       I am having Chad Rile maintain his ibuprofen, ondansetron, amLODipine, bictegravir-emtricitabine-tenofovir AF, and BIKTARVY.   No orders of the defined types were placed in this encounter.    Follow-up: Return in about 3 months (around 01/27/2019), or if symptoms worsen or fail to improve.   Marcos Eke, MSN, FNP-C Nurse Practitioner [facility] [place] Office phone: [phone] Pager: [place] number: [phone]

## 2018-10-28 NOTE — Assessment & Plan Note (Signed)
Chad [name]'s blood pressure is significantly improved from previous and tolerating medication with no adverse side effects. He has no current symptoms of end organ damage or headaches. Encouraged to monitor his blood pressure at home as able. Continue current dose of amlodipine.

## 2018-10-28 NOTE — Assessment & Plan Note (Signed)
   Pneumovax updated today.  Due for dental screening with referral to [facility] placed.

## 2018-11-15 MED FILL — BIKTARVY 50-200-25 MG TABS: 50-200-25 | 30 days supply | Qty: 30 | Fill #1

## 2019-01-01 MED FILL — BIKTARVY 50-200-25 MG TABS: 50-200-25 | 30 days supply | Qty: 30 | Fill #2

## 2019-01-27 ENCOUNTER — Other Ambulatory Visit: Payer: Self-pay

## 2019-01-27 DIAGNOSIS — B20 Human immunodeficiency virus [HIV] disease: Secondary | ICD-10-CM

## 2019-01-28 ENCOUNTER — Ambulatory Visit: Payer: Self-pay

## 2019-01-28 ENCOUNTER — Telehealth: Payer: Self-pay | Admitting: *Deleted

## 2019-01-28 DIAGNOSIS — A539 Syphilis, unspecified: Secondary | ICD-10-CM

## 2019-01-28 LAB — T-HELPER CELL (CD4) - (RCID CLINIC ONLY)
CD4 % Helper T Cell: 33 % (ref 33–55)
CD4 T Cell Abs: 1490 /uL (ref 400–2700)

## 2019-01-28 MED FILL — BIKTARVY 50-200-25 MG TABS: 50-200-25 | 30 days supply | Qty: 30 | Fill #3

## 2019-01-28 NOTE — Telephone Encounter (Signed)
-----   Message from Veryl Speak, FNP sent at 01/28/2019 12:33 PM EST ----- Please inform Mr. Mondschein that his test for Syphilis has come back positive. He is listed as allergic to penicillin, has he had the Bicillin injections in the past? If not we can treat him with doxycycline for 28 days (please send doxycycline 100 mg PO bid #56 to the pharmacy if using doxycycline or he will need 2.4 million units of Bicillin x 3 weekly)

## 2019-01-28 NOTE — Telephone Encounter (Signed)
RN called patient to relay results. He has not had any encounters, is upset at this result - broke into hysterical tears during conversation. He states the only time he has been treated was when he was also diagnosed with HIV in 2006, that he was hospitalized/had multiple IVs going for 3 months and is not sure how the syphilis was treated. RN spent 20 minutes speaking with patient, calming him down and reassuring him.  He does not qualify for [employer] (over income), pays out of pocket for amlodipine, is using patient assistance for [facility], unsure how he pays for labs/office visits. He will need assistance finding the lowest cost source for doxy. Please advise. Andree Coss, RN

## 2019-01-29 LAB — RPR: RPR Ser Ql: REACTIVE — AB

## 2019-01-29 LAB — COMPREHENSIVE METABOLIC PANEL
AG Ratio: 0.9 (calc) — ABNORMAL LOW (ref 1.0–2.5)
ALT: 24 U/L (ref 9–46)
AST: 26 U/L (ref 10–40)
Albumin: 4 g/dL (ref 3.6–5.1)
Alkaline phosphatase (APISO): 77 U/L (ref 36–130)
BUN: 9 mg/dL (ref 7–25)
CO2: 26 mmol/L (ref 20–32)
Calcium: 9 mg/dL (ref 8.6–10.3)
Chloride: 104 mmol/L (ref 98–110)
Creat: 0.87 mg/dL (ref 0.60–1.35)
GLOBULIN: 4.5 g/dL — AB (ref 1.9–3.7)
Glucose, Bld: 94 mg/dL (ref 65–99)
Potassium: 4.3 mmol/L (ref 3.5–5.3)
Sodium: 137 mmol/L (ref 135–146)
Total Bilirubin: 0.4 mg/dL (ref 0.2–1.2)
Total Protein: 8.5 g/dL — ABNORMAL HIGH (ref 6.1–8.1)

## 2019-01-29 LAB — CBC
HCT: 44 % (ref 38.5–50.0)
Hemoglobin: 15.6 g/dL (ref 13.2–17.1)
MCH: 32.6 pg (ref 27.0–33.0)
MCHC: 35.5 g/dL (ref 32.0–36.0)
MCV: 91.9 fL (ref 80.0–100.0)
MPV: 10.9 fL (ref 7.5–12.5)
PLATELETS: 207 10*3/uL (ref 140–400)
RBC: 4.79 10*6/uL (ref 4.20–5.80)
RDW: 12 % (ref 11.0–15.0)
WBC: 7 10*3/uL (ref 3.8–10.8)

## 2019-01-29 LAB — LIPID PANEL
Cholesterol: 151 mg/dL (ref <200)
HDL: 53 mg/dL (ref >=40)
LDL Cholesterol (Calc): 77 mg/dL (calc)
NON-HDL CHOLESTEROL (CALC): 98 mg/dL (ref <130)
Total CHOL/HDL Ratio: 2.8 (calc) (ref <5.0)
Triglycerides: 126 mg/dL (ref <150)

## 2019-01-29 LAB — HIV-1 RNA QUANT-NO REFLEX-BLD
HIV 1 RNA Quant: <20 copies/mL
HIV-1 RNA Quant, Log: <1.3 Log copies/mL

## 2019-01-29 LAB — FLUORESCENT TREPONEMAL AB(FTA)-IGG-BLD: Fluorescent Treponemal ABS: REACTIVE — AB

## 2019-01-29 LAB — RPR TITER: RPR Titer: 1:16 {titer} — ABNORMAL HIGH

## 2019-01-29 MED ORDER — DOXYCYCLINE HYCLATE 100 MG PO TABS: 100.0000 mg | ORAL_TABLET | Freq: Two times a day (BID) | ORAL | 0 refills | Status: DC

## 2019-01-29 NOTE — Telephone Encounter (Signed)
Thank you for update. I looked on GoodRx this morning and it would be $21. He may also consider contacting the Health Department to see if they offer treatment for less cost.

## 2019-01-29 NOTE — Telephone Encounter (Signed)
Called patient to see how he was doing today - much better.  Looked for the closest pharmacy with the best price for him, sent doxycycline 100 mg #56 to [facility] with goodRX coupon info listed.

## 2019-01-29 NOTE — Addendum Note (Signed)
Addended by: Andree Coss on: 01/29/2019 11:03 AM   Modules accepted: Orders

## 2019-02-10 ENCOUNTER — Encounter: Payer: Self-pay | Admitting: Family

## 2019-02-11 ENCOUNTER — Ambulatory Visit (INDEPENDENT_AMBULATORY_CARE_PROVIDER_SITE_OTHER): Payer: Self-pay | Admitting: Family

## 2019-02-11 ENCOUNTER — Telehealth: Payer: Self-pay | Admitting: Pharmacy Technician

## 2019-02-11 ENCOUNTER — Encounter: Payer: Self-pay | Admitting: Family

## 2019-02-11 ENCOUNTER — Ambulatory Visit (INDEPENDENT_AMBULATORY_CARE_PROVIDER_SITE_OTHER): Payer: Self-pay | Admitting: Licensed Clinical Social Worker

## 2019-02-11 ENCOUNTER — Other Ambulatory Visit: Payer: Self-pay

## 2019-02-11 VITALS — BP 154/110 | HR 98 | Temp 97.7°F | Wt 200.0 lb

## 2019-02-11 DIAGNOSIS — I10 Essential (primary) hypertension: Secondary | ICD-10-CM

## 2019-02-11 DIAGNOSIS — F411 Generalized anxiety disorder: Secondary | ICD-10-CM

## 2019-02-11 DIAGNOSIS — F418 Other specified anxiety disorders: Secondary | ICD-10-CM

## 2019-02-11 DIAGNOSIS — Z720 Tobacco use: Secondary | ICD-10-CM

## 2019-02-11 DIAGNOSIS — A539 Syphilis, unspecified: Secondary | ICD-10-CM

## 2019-02-11 DIAGNOSIS — B20 Human immunodeficiency virus [HIV] disease: Secondary | ICD-10-CM

## 2019-02-11 MED ORDER — BICTEGRAVIR-EMTRICITAB-TENOFOV 50-200-25 MG PO TABS: 1.0000 | ORAL_TABLET | Freq: Every day | ORAL | 5 refills | Status: DC

## 2019-02-11 NOTE — Progress Notes (Signed)
Subjective:    Patient ID: Chad [name], male    DOB: August 31, 1971, 48 y.o.   MRN: [id number]  Chief Complaint  Patient presents with  . HIV Positive/AIDS  . Syphilis     HPI:  Chad [name] is a 48 y.o. male who presents today for routine follow up of HIV disease.  [name] was last seen in the office on 10/28/2018 for routine follow-up with good adherence and tolerance to his ART regimen of Biktarvy with a viral load that remains undetectable.  Most recent blood work completed on 01/27/2019 with continued viral suppression with a viral load that remains undetectable and CD4 count of 1490.  RPR was positive with a titer of 1:16. He was prescribed doxycycline given his Penicillin allergy.  Kidney function, liver function, and electrolytes within normal ranges.  Lipid profile with LDL of 77, HDL of 53, and triglycerides of 126.  Chad [name] has been taking his Biktarvy as as prescribed with no adverse side effects or missed doses.  He has also been taking his doxycycline for recent diagnosis of syphilis.  Currently feeling okay having lots of anxiety related to newly diagnosed with syphilis as well as novel coronavirus and uncertainty surrounding it.  Chad [name] continues to receive his Susanne Borders from advancing access which expires in May of this year.  He has no problems obtaining his medication.  He does continue to smoke and is interested in quitting smoking.  Not currently sexually active having broken up with his partner in November.  Denies recreational or illicit drug use.    Allergies  Allergen Reactions  . Penicillins Other (See Comments)    Other Reaction: mild hives in remote past      Outpatient Medications Prior to Visit  Medication Sig Dispense Refill  . amLODipine (NORVASC) 10 MG tablet Take 1 tablet (10 mg total) by mouth daily. 30 tablet 11  . doxycycline (VIBRA-TABS) 100 MG tablet Take 1 tablet (100 mg total) by mouth 2 (two) times daily. 56 tablet 0  .  ibuprofen (ADVIL,MOTRIN) 800 MG tablet Take 1 tablet (800 mg total) by mouth every 8 (eight) hours as needed. 21 tablet 0  . ondansetron (ZOFRAN ODT) 4 MG disintegrating tablet Take 1 tablet (4 mg total) by mouth every 8 (eight) hours as needed for nausea or vomiting. 20 tablet 0  . bictegravir-emtricitabine-tenofovir AF (BIKTARVY) 50-200-25 MG TABS tablet Take 1 tablet by mouth daily. 30 tablet 5  . BIKTARVY 50-200-25 MG TABS tablet TAKE 1 TABLET BY MOUTH DAILY. 30 tablet 5   No facility-administered medications prior to visit.      Past Medical History:  Diagnosis Date  . HIV (human immunodeficiency virus infection) (HCC)   . Tuberculosis      Past Surgical History:  Procedure Laterality Date  . HERNIA REPAIR    . TONSILLECTOMY        Review of Systems  Constitutional: Negative for appetite change, chills, fatigue, fever and unexpected weight change.  Eyes: Negative for visual disturbance.  Respiratory: Negative for cough, chest tightness, shortness of breath and wheezing.   Cardiovascular: Negative for chest pain and leg swelling.  Gastrointestinal: Negative for abdominal pain, constipation, diarrhea, nausea and vomiting.  Genitourinary: Negative for dysuria, flank pain, frequency, genital sores, hematuria and urgency.  Skin: Negative for rash.  Allergic/Immunologic: Negative for immunocompromised state.  Neurological: Negative for dizziness and headaches.      Objective:    BP (!) 154/110   Pulse 98   Temp  97.7 F (36.5 C)   Wt 200 lb (90.7 kg)   BMI 29.53 kg/m  Nursing note and vital signs reviewed.  Physical Exam Constitutional:      General: He is not in acute distress.    Appearance: He is well-developed.  Eyes:     Conjunctiva/sclera: Conjunctivae normal.  Neck:     Musculoskeletal: Neck supple.  Cardiovascular:     Rate and Rhythm: Normal rate and regular rhythm.     Heart sounds: Normal heart sounds. No murmur. No friction rub. No gallop.    Pulmonary:     Effort: Pulmonary effort is normal. No respiratory distress.     Breath sounds: Normal breath sounds. No wheezing or rales.  Chest:     Chest wall: No tenderness.  Abdominal:     General: Bowel sounds are normal.     Palpations: Abdomen is soft.     Tenderness: There is no abdominal tenderness.  Lymphadenopathy:     Cervical: No cervical adenopathy.  Skin:    General: Skin is warm and dry.     Findings: No rash.  Neurological:     Mental Status: He is alert and oriented to person, place, and time.  Psychiatric:        Mood and Affect: Mood is anxious.        Behavior: Behavior normal.        Thought Content: Thought content normal.        Judgment: Judgment normal.        Assessment & Plan:   Problem List Items Addressed This Visit      Cardiovascular and Mediastinum   Essential hypertension    Blood pressure elevated today likely related to his stress and anxiety. He has no headaches or changes in vision. Will continue current dose of amlodipine and encouraged to monitor blood pressure at home. Follow up if blood pressure remains elevated.         Other   HIV disease (HCC) - Primary    Chad [name] has well controlled HIV disease with good adherence and tolerance to his current regimen of Biktarvy. He has no sign/symptoms of opportunistic infection or progressive HIV disease. Will need to explore options for financial medication assistance and he will work with our pharmacy staff as his current [facility] ends in May. Continue current dose of Biktarvy. Plan for office follow up in 4 months or sooner if needed with blood work 1-2 weeks prior to appointment.       Relevant Medications   bictegravir-emtricitabine-tenofovir AF (BIKTARVY) 50-200-25 MG TABS tablet   Other Relevant Orders   T-helper cell (CD4)- ([facility] clinic only)   HIV-1 RNA quant-no reflex-bld   Comprehensive metabolic panel   Tobacco use    Interested in quitting smoking. Discussed  resources and methods available including patches, gums, inhalers and medication. He appears to be in the contemplation stage of change and getting ready to start making positive changes. Continue to monitor.       Situational anxiety    Chad [name] has several stressors which appear to be leading to anxiety. Discussed importance of stress relief and how this can effect his blood pressure. Warm handoff provided to [facility] our counselor who will continue to follow as well. He was feeling better on discharge.       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 He is currently completing 28 day course of doxycycline.  Will recheck RPR in 6 months.       Relevant Medications   bictegravir-emtricitabine-tenofovir AF (BIKTARVY) 50-200-25 MG TABS tablet       I have discontinued Chad [name]'s bictegravir-emtricitabine-tenofovir AF. I have also changed his Biktarvy to bictegravir-emtricitabine-tenofovir AF. Additionally, I am having him maintain his ibuprofen, ondansetron, amLODipine, and doxycycline.   Meds ordered this encounter  Medications  . bictegravir-emtricitabine-tenofovir AF (BIKTARVY) 50-200-25 MG TABS tablet    Sig: Take 1 tablet by mouth daily.    Dispense:  30 tablet    Refill:  5    Order Specific Question:   Supervising Provider    Answer:   Judyann MunsonSNIDER, CYNTHIA [[id number]]     Follow-up: Return in about 4 months (around 06/13/2019), or if symptoms worsen or fail to improve.   Chad EkeGreg Lisett Dirusso, MSN, FNP-C Nurse Practitioner [facility] Office phone: [phone] Pager: [phone] [facility] Main number: [phone]

## 2019-02-11 NOTE — Assessment & Plan Note (Signed)
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 He is currently completing 28 day course of doxycycline. Will recheck RPR in 6 months.

## 2019-02-11 NOTE — BH Specialist Note (Signed)
Integrated Behavioral Health Initial Visit  MRN: [id number] Name: Chad [name]  Number of Integrated Behavioral Health Clinician visits:: 1/6 Session Start time: 10:53am  Session End time: 11:31am Total time: 35 minutes  Type of Service: Integrated Behavioral Health- Individual/Family Interpretor:No. Interpretor Name and Language: n/a   Warm Hand Off Completed.       SUBJECTIVE: Chad [name] is a 48 y.o. male accompanied by self Patient was referred by Marcos Eke for high anxiety. Patient reports the following symptoms/concerns: intrusive worry thoughts, afraid to take any risks (even those that would move him ahead at work, etc), trouble focusing and concentrating, crying more easily Duration of problem: unknown; Severity of problem: moderate  OBJECTIVE: Mood: Anxious and Affect: Constricted Risk of harm to self or others: No plan to harm self or others  LIFE CONTEXT: Patient reports that he is from [place] and most of his family still lives there. He has a 37 year old daughter who was raised in [place], but has now moved to [place]. Patient works at [employer] as a Production assistant, radio and reports it is a high stress situation, but he has a lot of support at work. He also has a younger brother who lives here and they are very close. This is the person he has told about his HIV status since being diagnosed in 2006.  GOALS ADDRESSED: Patient will: 1. Reduce symptoms of: anxiety  INTERVENTIONS: Interventions utilized: Motivational Interviewing and Solution-Focused Strategies    ASSESSMENT: Patient currently experiencing  intrusive worry thoughts, afraid to take any risks (even those that would move him ahead at work, etc), trouble focusing and concentrating, crying more easily. The most appropriate diagnosis for his symptoms at this time is Generalized Anxiety Disorder.  Counselor explored with patient his anxiety symptoms. He states that he has always being a "worrywort" but that in th  past few years he has gotten increasingly anxious. Patient shares that all his life he has done all the things he was supposed to do, and now he looks around and not only does he not know why he did it (other than to please others), he also does not know who he is. Now, he spends most of his day everyday worrying about what may happen to him, his family, his daughter, his work, Glass blower/designer guided patient to identify changes he would like to see in his life. Patient would like to be able to relax and trust that he is making the right choices and that they are the best choices for him, not just for others. He also states that he would like to not have so much pressure from his family. Counselor processed these feelings of stress and pressure with patient. Patient shares that his family always expects him to have everything together. He is the one they call when anyone is struggling and he feels obligated to be there for them. However, when patient is struggling, they often do not respond to him, or make placating statements rather than actually helping. Counselor noted that patient became increasingly anxious while talking about this. Counselor taught patient sensory grounding techniques to use when he begins to notice worry thoughts.    Patient may benefit from weekly CBT sessions to address anxiety.  PLAN: 1. Follow up with behavioral health clinician on : 02/20/19  [place]us [place], LCSW

## 2019-02-11 NOTE — Assessment & Plan Note (Signed)
Blood pressure elevated today likely related to his stress and anxiety. He has no headaches or changes in vision. Will continue current dose of amlodipine and encouraged to monitor blood pressure at home. Follow up if blood pressure remains elevated.

## 2019-02-11 NOTE — Assessment & Plan Note (Signed)
Mr. Obenhaus has several stressors which appear to be leading to anxiety. Discussed importance of stress relief and how this can effect his blood pressure. Warm handoff provided to [facility] our counselor who will continue to follow as well. He was feeling better on discharge.

## 2019-02-11 NOTE — Telephone Encounter (Signed)
[facility] Patient Advocate Encounter   Patient application for [employer] Advancing Access Patient Assistance Program for Susanne Borders  will be sent to try and get another year approval in their program.  I have spoken with the patient and they will bring in additional income verification so that we can have them on hand for appeal to get extended annual approval. Will continue to follow. Patient's coverage expires in May of this year.  Beulah Gandy, CPhT Specialty Pharmacy Patient [facility] for Infectious Disease Phone: [phone] Fax: [phone] 02/11/2019 12:07 PM

## 2019-02-11 NOTE — Assessment & Plan Note (Signed)
Mr. Pullar has well controlled HIV disease with good adherence and tolerance to his current regimen of Biktarvy. He has no sign/symptoms of opportunistic infection or progressive HIV disease. Will need to explore options for financial medication assistance and he will work with our pharmacy staff as his current Advancing Access ends in May. Continue current dose of Biktarvy. Plan for office follow up in 4 months or sooner if needed with blood work 1-2 weeks prior to appointment.

## 2019-02-11 NOTE — Assessment & Plan Note (Signed)
Interested in quitting smoking. Discussed resources and methods available including patches, gums, inhalers and medication. He appears to be in the contemplation stage of change and getting ready to start making positive changes. Continue to monitor.

## 2019-02-11 NOTE — Patient Instructions (Addendum)
Nice to see you!  Continue to take your Biktarvy and doxycycline as prescribed.  Please with our pharmacy staff regarding obtaining your medication.   Work on finding your stress relief. Continue to monitor your blood pressure.  We will continue to work on helping you to quit smoking. Information is below.  Plan for follow up in 4 months or sooner if needed with lab work 1-2 weeks prior to your appointment.  Have a great day!

## 2019-02-14 ENCOUNTER — Other Ambulatory Visit: Payer: Self-pay | Admitting: *Deleted

## 2019-02-14 DIAGNOSIS — I10 Essential (primary) hypertension: Secondary | ICD-10-CM

## 2019-02-14 MED ORDER — AMLODIPINE BESYLATE 10 MG PO TABS: 10.0000 mg | ORAL_TABLET | Freq: Every day | ORAL | 5 refills | Status: DC

## 2019-02-14 NOTE — Telephone Encounter (Signed)
Spoke with Chad [name] about application status on 03/20, approval is pending an ADAP denial letter.  They currently have all of his income documents and application and a note on his account.

## 2019-02-18 MED FILL — BIKTARVY 50-200-25 MG TABS: 50-200-25 | 30 days supply | Qty: 30 | Fill #4

## 2019-02-20 ENCOUNTER — Institutional Professional Consult (permissible substitution): Payer: Self-pay | Admitting: Licensed Clinical Social Worker

## 2019-03-15 MED FILL — BIKTARVY 50-200-25 MG TABS: 50-200-25 | 30 days supply | Qty: 30 | Fill #5

## 2019-04-05 ENCOUNTER — Other Ambulatory Visit: Payer: Self-pay | Admitting: Family

## 2019-04-05 DIAGNOSIS — B20 Human immunodeficiency virus [HIV] disease: Secondary | ICD-10-CM

## 2019-04-09 MED FILL — BIKTARVY 50-200-25 MG TABS: 50-200-25 | 30 days supply | Qty: 30 | Fill #0

## 2019-04-14 NOTE — Telephone Encounter (Signed)
[facility] Patient Advocate Encounter  Completed and sent [employer] Advancing Access application for Biktarvy for this patient who is uninsured.    Patient is approved 04/11/2019 through 11/27/2019.  BIN      [id number] PCN    [id number] GRP    [id number] ID        [id number]   Kathie Rhodes E. Dimas Aguas CPhT Specialty Pharmacy Patient [facility] for Infectious Disease Phone: [phone] Fax:  [phone]

## 2019-04-24 ENCOUNTER — Telehealth: Payer: Self-pay

## 2019-04-24 NOTE — Telephone Encounter (Signed)
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 Patient states he is anxious with the COVID-19 outbreak and would like to know if it is safe for him to return. Was able to provide patient with advise on how to lower infection. Patient states he will be returning to work with a mask, and will receive additional training at work. Patient verbalized reassurance and will follow up with office on 7/17.   What to do if you are LOW RISK for COVID-19?  Reduce your risk of any infection by using the same precautions used for avoiding the common cold or flu:  Marland Kitchen Wash your hands often with soap and warm water for at least 20 seconds.  If soap and water are not readily available, use an alcohol-based hand sanitizer with at least 60% alcohol.  . If coughing or sneezing, cover your mouth and nose by coughing or sneezing into the elbow areas of your shirt or coat, into a tissue or into your sleeve (not your hands). . Avoid shaking hands with others and consider head nods or verbal greetings only. . Avoid touching your eyes, nose, or mouth with unwashed hands.  . Avoid close contact with people who are sick. . Avoid places or events with large numbers of people in one location, like concerts or sporting events. . Carefully consider travel plans you have or are making. . If you are planning any travel outside or inside the [place], visit the CDC's Travelers' Health webpage for the latest health notices. . If you have some symptoms but not all symptoms, continue to monitor at home and seek medical attention if your symptoms worsen. . If you are having a medical emergency, call 911.  Lorenso Courier, [place]

## 2019-05-08 MED FILL — BIKTARVY 50-200-25 MG TABS: 50-200-25 | 30 days supply | Qty: 30 | Fill #1

## 2019-06-02 MED FILL — BIKTARVY 50-200-25 MG TABS: 50-200-25 | 30 days supply | Qty: 30 | Fill #2

## 2019-06-11 ENCOUNTER — Other Ambulatory Visit: Payer: Self-pay

## 2019-06-11 DIAGNOSIS — B20 Human immunodeficiency virus [HIV] disease: Secondary | ICD-10-CM

## 2019-06-12 LAB — T-HELPER CELL (CD4) - (RCID CLINIC ONLY)
CD4 % Helper T Cell: 35 % (ref 33–65)
CD4 T Cell Abs: 1414 /uL (ref 400–1790)

## 2019-06-13 ENCOUNTER — Other Ambulatory Visit: Payer: Self-pay

## 2019-06-14 LAB — HIV-1 RNA QUANT-NO REFLEX-BLD
HIV 1 RNA Quant: <20 copies/mL
HIV-1 RNA Quant, Log: <1.3 Log copies/mL

## 2019-06-14 LAB — COMPREHENSIVE METABOLIC PANEL
AG Ratio: 0.9 (calc) — ABNORMAL LOW (ref 1.0–2.5)
ALT: 31 U/L (ref 9–46)
AST: 32 U/L (ref 10–40)
Albumin: 4 g/dL (ref 3.6–5.1)
Alkaline phosphatase (APISO): 84 U/L (ref 36–130)
BUN: 10 mg/dL (ref 7–25)
CO2: 25 mmol/L (ref 20–32)
Calcium: 8.9 mg/dL (ref 8.6–10.3)
Chloride: 104 mmol/L (ref 98–110)
Creat: 0.86 mg/dL (ref 0.60–1.35)
Globulin: 4.4 g/dL (calc) — ABNORMAL HIGH (ref 1.9–3.7)
Glucose, Bld: 122 mg/dL — ABNORMAL HIGH (ref 65–99)
Potassium: 3.8 mmol/L (ref 3.5–5.3)
Sodium: 137 mmol/L (ref 135–146)
Total Bilirubin: 0.3 mg/dL (ref 0.2–1.2)
Total Protein: 8.4 g/dL — ABNORMAL HIGH (ref 6.1–8.1)

## 2019-06-17 ENCOUNTER — Telehealth: Payer: Self-pay

## 2019-06-17 NOTE — Telephone Encounter (Signed)
-----   Message from Golden Circle, FNP sent at 06/16/2019 11:38 AM EDT ----- Please inform Mr. Afzal that his viral load is undetectable and CD4 count is 1,414. Blood sugar was slightly elevated at 122 and not sure if this was fasting (if it is may need to consider A1c for diabetes check). Kidney function, liver function and electrolytes within the normal ranges. Continue Biktarvy and follow up in August as planned.

## 2019-06-17 NOTE — Telephone Encounter (Signed)
Attempted to call patient regarding FNP message about lab work. Left voicemail requesting patient call back to review labs. Did not disclose any information in message. [place]

## 2019-06-18 NOTE — Telephone Encounter (Signed)
Patient returned call regarding voicemail from 7/21. Was able to confirm patient before relaying lab results. Patient states he was not fasting, and could be why his blood sugar was elevated. Patient did not have any other questions about labs, and will follow-up in August as planned. [place]

## 2019-06-30 ENCOUNTER — Other Ambulatory Visit: Payer: Self-pay

## 2019-06-30 ENCOUNTER — Encounter: Payer: Self-pay | Admitting: Family

## 2019-06-30 ENCOUNTER — Ambulatory Visit (INDEPENDENT_AMBULATORY_CARE_PROVIDER_SITE_OTHER): Payer: Self-pay | Admitting: Family

## 2019-06-30 VITALS — BP 154/91 | HR 94 | Temp 98.0°F

## 2019-06-30 DIAGNOSIS — I10 Essential (primary) hypertension: Secondary | ICD-10-CM

## 2019-06-30 DIAGNOSIS — Z0000 Encounter for general adult medical examination without abnormal findings: Secondary | ICD-10-CM

## 2019-06-30 DIAGNOSIS — A539 Syphilis, unspecified: Secondary | ICD-10-CM

## 2019-06-30 DIAGNOSIS — B20 Human immunodeficiency virus [HIV] disease: Secondary | ICD-10-CM

## 2019-06-30 MED ORDER — HYDROCHLOROTHIAZIDE 25 MG PO TABS: 25.0000 mg | ORAL_TABLET | Freq: Every day | ORAL | 2 refills | Status: DC

## 2019-06-30 MED ORDER — BIKTARVY 50-200-25 MG PO TABS: 1.0000 | ORAL_TABLET | Freq: Every day | ORAL | 5 refills | Status: DC

## 2019-06-30 MED FILL — BIKTARVY 50-200-25 MG TABS: 50-200-25 | 30 days supply | Qty: 30 | Fill #3

## 2019-06-30 NOTE — Assessment & Plan Note (Signed)
Blood pressure remains elevated above goal 140/90 today.  Amlodipine resulting in lower extremity edema with no adverse side effects or symptoms.  Discussed treatment options and will discontinue amlodipine and start hydrochlorothiazide.  Encouraged to monitor blood pressure at home and follow DASH diet.  No red flags/warning symptoms concerning for changes in vision/headaches.  Will recheck blood pressure in 1 month or sooner if needed.

## 2019-06-30 NOTE — Assessment & Plan Note (Addendum)
   Discussed importance of safe sexual practice to reduce risk of acquisition/transmission of STI.  Declines condoms today.  All immunizations are currently up-to-date.

## 2019-06-30 NOTE — Assessment & Plan Note (Signed)
Chad [name] has well-controlled HIV disease with good adherence and tolerance to his ART regimen of Biktarvy.  No signs/symptoms of opportunistic infection or progressive HIV disease at present.  Working on financial assistance to continue to receive medication uninterrupted.  Continue current dose of Biktarvy.  Plan for follow-up in 4 months or sooner if needed with lab work 1 to 2 weeks prior to appointment.

## 2019-06-30 NOTE — Patient Instructions (Signed)
Nice to see you.  Continue to take your [place] as prescribed daily.  Refills have been sent to the pharmacy.  STOP taking amlodipine and START taking hydrochlorothiazide.   Plan for follow up in 4 months or sooner if needed with lab work 1-2 weeks prior to appointment.  Have a great day and stay safe!

## 2019-06-30 NOTE — Progress Notes (Signed)
Subjective:    Patient ID: Chad [name], male    DOB: [birth year], 48 y.o.   MRN: [id number]  Chief Complaint  Patient presents with  . HIV Positive/AIDS     HPI:  Chad [name] is a 48 y.o. male with HIV disease who was last seen in the office on 02/11/2019 with good adherence and tolerance to his ART regimen of Biktarvy.  Viral load at the time was undetectable with CD4 count of 1490.  Most recent blood work completed on 06/11/2019 with viral load that remains undetectable and CD4 count of 1414.  Kidney function, liver function, and electrolytes within normal ranges.  Chad [name] continues to take his Biktarvy as prescribed no adverse side effects or missed doses.  Overall feeling very well today.  Has had some mild edema located in his bilateral lower extremity since starting amlodipine. Denies fevers, chills, night sweats, headaches, changes in vision, neck pain/stiffness, nausea, diarrhea, vomiting, lesions or rashes.  Chad [name] has no problems obtaining his medication from the pharmacy and currently working with our financial counselors to continue uninterrupted medication.  Denies feelings of being down, depressed, or hopeless recently.  Continues to smoke marijuana on occasion.  Alcohol use is about 7-10 drinks per week on average.  He does continue to smoke cigarettes daily.  He continues working full-time.  Lives with his brother and having some challenges wishing to be living independently.   Allergies  Allergen Reactions  . Penicillins Other (See Comments)    Other Reaction: mild hives in remote past      Outpatient Medications Prior to Visit  Medication Sig Dispense Refill  . ibuprofen (ADVIL,MOTRIN) 800 MG tablet Take 1 tablet (800 mg total) by mouth every 8 (eight) hours as needed. 21 tablet 0  . amLODipine (NORVASC) 10 MG tablet Take 1 tablet (10 mg total) by mouth daily. 30 tablet 5  . BIKTARVY 50-200-25 MG TABS tablet TAKE 1 TABLET BY MOUTH DAILY. 30 tablet  5  . doxycycline (VIBRA-TABS) 100 MG tablet Take 1 tablet (100 mg total) by mouth 2 (two) times daily. (Patient not taking: Reported on 06/30/2019) 56 tablet 0  . ondansetron (ZOFRAN ODT) 4 MG disintegrating tablet Take 1 tablet (4 mg total) by mouth every 8 (eight) hours as needed for nausea or vomiting. (Patient not taking: Reported on 06/30/2019) 20 tablet 0   No facility-administered medications prior to visit.      Past Medical History:  Diagnosis Date  . HIV (human immunodeficiency virus infection) (HCC)   . Tuberculosis      Past Surgical History:  Procedure Laterality Date  . HERNIA REPAIR    . TONSILLECTOMY         Review of Systems  Constitutional: Negative for appetite change, chills, fatigue, fever and unexpected weight change.  Eyes: Negative for visual disturbance.  Respiratory: Negative for cough, chest tightness, shortness of breath and wheezing.   Cardiovascular: Negative for chest pain and leg swelling.  Gastrointestinal: Negative for abdominal pain, constipation, diarrhea, nausea and vomiting.  Genitourinary: Negative for dysuria, flank pain, frequency, genital sores, hematuria and urgency.  Skin: Negative for rash.  Allergic/Immunologic: Negative for immunocompromised state.  Neurological: Negative for dizziness and headaches.      Objective:    BP (!) 154/91   Pulse 94   Temp 98 F (36.7 C)  Nursing note and vital signs reviewed.  Physical Exam Constitutional:      General: He is not in acute distress.    Appearance:  He is well-developed.  Eyes:     Conjunctiva/sclera: Conjunctivae normal.  Neck:     Musculoskeletal: Neck supple.  Cardiovascular:     Rate and Rhythm: Normal rate and regular rhythm.     Heart sounds: Normal heart sounds. No murmur. No friction rub. No gallop.   Pulmonary:     Effort: Pulmonary effort is normal. No respiratory distress.     Breath sounds: Normal breath sounds. No wheezing or rales.  Chest:     Chest wall: No  tenderness.  Abdominal:     General: Bowel sounds are normal.     Palpations: Abdomen is soft.     Tenderness: There is no abdominal tenderness.  Lymphadenopathy:     Cervical: No cervical adenopathy.  Skin:    General: Skin is warm and dry.     Findings: No rash.  Neurological:     Mental Status: He is alert and oriented to person, place, and time.  Psychiatric:        Behavior: Behavior normal.        Thought Content: Thought content normal.        Judgment: Judgment normal.      Dep[facility] screen [facility] 2/[facility] 10/28/2018 07/30/2018 05/09/2018 04/09/2018  Decreased Interest 0 0 0 0  Down, Depressed, Hopeless 0 0 0 0  PHQ - 2 Score 0 0 0 0       Assessment & Plan:   Problem List Items Addressed This Visit      Cardiovascular and Mediastinum   Essential hypertension    Blood pressure remains elevated above goal 140/90 today.  Amlodipine resulting in lower extremity edema with no adverse side effects or symptoms.  Discussed treatment options and will discontinue amlodipine and start hydrochlorothiazide.  Encouraged to monitor blood pressure at home and follow DASH diet.  No red flags/warning symptoms concerning for changes in vision/headaches.  Will recheck blood pressure in 1 month or sooner if needed.      Relevant Medications   hydrochlorothiazide (HYDRODIURIL) 25 MG tablet     Other   HIV disease [facility](HCC)    Chad [name] has well-controlled HIV disease with good adherence and tolerance to his ART regimen of Biktarvy.  No signs/symptoms of opportunistic infection or progressive HIV disease at present.  Working on financial assistance to continue to receive medication uninterrupted.  Continue current dose of Biktarvy.  Plan for follow-up in 4 months or sooner if needed with lab work 1 to 2 weeks prior to appointment.      Relevant Medications   bictegravir-emtricitabine-tenofovir AF (BIKTARVY) 50-200-25 MG TABS tablet   Other Relevant Orders   T-helper cell (CD4)- ([facility] clinic only)    HIV-1 RNA quant-no reflex-bld   CBC   Comprehensive metabolic panel   Healthcare maintenance     Discussed importance of safe sexual practice to reduce risk of acquisition/transmission of STI.  Declines condoms today.  All immunizations are currently up-to-date.      Syphilis - Primary    Previously treated for syphilis with titer of 1: 16.  Will recheck RPR in 1 month to determine effectiveness of treatment and if additional treatment is necessary.      Relevant Medications   bictegravir-emtricitabine-tenofovir AF (BIKTARVY) 50-200-25 MG TABS tablet   Other Relevant Orders   RPR   RPR       I have discontinued Aneta MinsPhillip Vorce's ondansetron, doxycycline, and amLODipine. I have also changed his Biktarvy. Additionally, I am having him start on hydrochlorothiazide. Lastly, I  am having him maintain his ibuprofen.   Meds ordered this encounter  Medications  . hydrochlorothiazide (HYDRODIURIL) 25 MG tablet    Sig: Take 1 tablet (25 mg total) by mouth daily.    Dispense:  30 tablet    Refill:  2    Order Specific Question:   Supervising Provider    Answer:   Carlyle Basques [[id number]]  . bictegravir-emtricitabine-tenofovir AF (BIKTARVY) 50-200-25 MG TABS tablet    Sig: Take 1 tablet by mouth daily.    Dispense:  30 tablet    Refill:  5    Order Specific Question:   Supervising Provider    Answer:   Carlyle Basques [[id number]]     Follow-up: Return in about 4 months (around 10/30/2019), or if symptoms worsen or fail to improve.   Terri Piedra, MSN, FNP-C Nurse Practitioner [facility] for Infectious Disease [place] number: [phone]

## 2019-06-30 NOTE — Assessment & Plan Note (Signed)
Previously treated for syphilis with titer of 1: 16.  Will recheck RPR in 1 month to determine effectiveness of treatment and if additional treatment is necessary.

## 2019-07-24 MED FILL — BIKTARVY 50-200-25 MG TABS: 50-200-25 | 30 days supply | Qty: 30 | Fill #4

## 2019-08-25 MED FILL — BIKTARVY 50-200-25 MG TABS: 50-200-25 | 30 days supply | Qty: 30 | Fill #5

## 2019-09-17 ENCOUNTER — Ambulatory Visit (INDEPENDENT_AMBULATORY_CARE_PROVIDER_SITE_OTHER): Payer: Self-pay

## 2019-09-17 ENCOUNTER — Other Ambulatory Visit: Payer: Self-pay | Admitting: Family

## 2019-09-17 ENCOUNTER — Other Ambulatory Visit: Payer: Self-pay

## 2019-09-17 DIAGNOSIS — Z23 Encounter for immunization: Secondary | ICD-10-CM

## 2019-09-17 DIAGNOSIS — B20 Human immunodeficiency virus [HIV] disease: Secondary | ICD-10-CM

## 2019-09-24 ENCOUNTER — Other Ambulatory Visit: Payer: Self-pay | Admitting: Family

## 2019-09-24 DIAGNOSIS — I10 Essential (primary) hypertension: Secondary | ICD-10-CM

## 2019-09-25 MED FILL — BIKTARVY 50-200-25 MG TABS: 50-200-25 | 30 days supply | Qty: 30 | Fill #0

## 2019-10-21 MED FILL — BIKTARVY 50-200-25 MG TABS: 50-200-25 | 30 days supply | Qty: 30 | Fill #1

## 2019-10-30 ENCOUNTER — Other Ambulatory Visit: Payer: Self-pay

## 2019-10-31 ENCOUNTER — Other Ambulatory Visit: Payer: Self-pay

## 2019-10-31 DIAGNOSIS — A539 Syphilis, unspecified: Secondary | ICD-10-CM

## 2019-10-31 DIAGNOSIS — B20 Human immunodeficiency virus [HIV] disease: Secondary | ICD-10-CM

## 2019-10-31 LAB — T-HELPER CELL (CD4) - (RCID CLINIC ONLY)
CD4 % Helper T Cell: 36 % (ref 33–65)
CD4 T Cell Abs: 2164 /uL — ABNORMAL HIGH (ref 400–1790)

## 2019-11-10 LAB — COMPREHENSIVE METABOLIC PANEL
AG Ratio: 1 (calc) (ref 1.0–2.5)
ALT: 27 U/L (ref 9–46)
AST: 27 U/L (ref 10–40)
Albumin: 4.1 g/dL (ref 3.6–5.1)
Alkaline phosphatase (APISO): 72 U/L (ref 36–130)
BUN: 9 mg/dL (ref 7–25)
CO2: 26 mmol/L (ref 20–32)
Calcium: 9.2 mg/dL (ref 8.6–10.3)
Chloride: 101 mmol/L (ref 98–110)
Creat: 0.94 mg/dL (ref 0.60–1.35)
Globulin: 4.1 g/dL (calc) — ABNORMAL HIGH (ref 1.9–3.7)
Glucose, Bld: 106 mg/dL — ABNORMAL HIGH (ref 65–99)
Potassium: 4 mmol/L (ref 3.5–5.3)
Sodium: 137 mmol/L (ref 135–146)
Total Bilirubin: 0.4 mg/dL (ref 0.2–1.2)
Total Protein: 8.2 g/dL — ABNORMAL HIGH (ref 6.1–8.1)

## 2019-11-10 LAB — CBC
HCT: 46.7 % (ref 38.5–50.0)
Hemoglobin: 16.1 g/dL (ref 13.2–17.1)
MCH: 31.3 pg (ref 27.0–33.0)
MCHC: 34.5 g/dL (ref 32.0–36.0)
MCV: 90.9 fL (ref 80.0–100.0)
MPV: 10.6 fL (ref 7.5–12.5)
Platelets: 277 10*3/uL (ref 140–400)
RBC: 5.14 10*6/uL (ref 4.20–5.80)
RDW: 13.3 % (ref 11.0–15.0)
WBC: 9.7 10*3/uL (ref 3.8–10.8)

## 2019-11-10 LAB — FLUORESCENT TREPONEMAL AB(FTA)-IGG-BLD: Fluorescent Treponemal ABS: REACTIVE — AB

## 2019-11-10 LAB — HIV-1 RNA QUANT-NO REFLEX-BLD
HIV 1 RNA Quant: <20 copies/mL
HIV-1 RNA Quant, Log: <1.3 Log copies/mL

## 2019-11-10 LAB — RPR TITER: RPR Titer: 1:4 {titer} — ABNORMAL HIGH

## 2019-11-10 LAB — RPR: RPR Ser Ql: REACTIVE — AB

## 2019-11-14 ENCOUNTER — Telehealth: Payer: Self-pay

## 2019-11-14 NOTE — Telephone Encounter (Signed)
COVID-19 Pre-Screening Questions:  Do you currently have a fever (>100 F), chills or unexplained body aches? NO   Are you currently experiencing new cough, shortness of breath, sore throat, runny nose?NO .  Have you recently travelled outside the state of  in the last 14 days? NO .  Have you been in contact with someone that is currently pending confirmation of Covid19 testing or has been confirmed to have the Covid19 virus?  NO  **If the patient answers NO to ALL questions -  advise the patient to please call the clinic before coming to the office should any symptoms develop.     

## 2019-11-17 ENCOUNTER — Encounter: Payer: Self-pay | Admitting: Family

## 2019-12-01 ENCOUNTER — Telehealth: Payer: Self-pay | Admitting: Pharmacy Technician

## 2019-12-01 NOTE — Telephone Encounter (Addendum)
[facility] Patient Advocate Encounter  Completed and sent [employer] Advancing Access application for Biktarvy for this patient who is uninsured.    Patient is approved 12/02/2019 through 12/01/2020.  BIN      [id number] PCN    [id number] GRP    [id number] ID        [id number]  This information has been shared with [facility] Specialty pharmacy.   Netty Starring. Dimas Aguas CPhT Specialty Pharmacy Patient [facility] for Infectious Disease Phone: [phone] Fax:  [phone]

## 2019-12-02 MED FILL — BIKTARVY 50-200-25 MG TABS: 50-200-25 | 30 days supply | Qty: 30 | Fill #2

## 2019-12-03 ENCOUNTER — Telehealth: Payer: Self-pay

## 2019-12-03 NOTE — Telephone Encounter (Signed)
COVID-19 Pre-Screening Questions:12/03/19  Do you currently have a fever (>100 F), chills or unexplained body aches?NO   Are you currently experiencing new cough, shortness of breath, sore throat, runny nose? NO .  Have you recently travelled outside the state of [place] in the last 14 days? NO  .  Have you been in contact with someone that is currently pending confirmation of Covid19 testing or has been confirmed to have the Covid19 virus?  NO  **If the patient answers NO to ALL questions -  advise the patient to please call the clinic before coming to the office should any symptoms develop.

## 2019-12-04 ENCOUNTER — Ambulatory Visit (INDEPENDENT_AMBULATORY_CARE_PROVIDER_SITE_OTHER): Payer: Self-pay | Admitting: Family

## 2019-12-04 ENCOUNTER — Other Ambulatory Visit: Payer: Self-pay

## 2019-12-04 ENCOUNTER — Encounter: Payer: Self-pay | Admitting: Family

## 2019-12-04 VITALS — BP 167/111 | HR 98 | Wt 215.2 lb

## 2019-12-04 DIAGNOSIS — B20 Human immunodeficiency virus [HIV] disease: Secondary | ICD-10-CM

## 2019-12-04 DIAGNOSIS — Z0000 Encounter for general adult medical examination without abnormal findings: Secondary | ICD-10-CM

## 2019-12-04 DIAGNOSIS — Z113 Encounter for screening for infections with a predominantly sexual mode of transmission: Secondary | ICD-10-CM

## 2019-12-04 DIAGNOSIS — A539 Syphilis, unspecified: Secondary | ICD-10-CM

## 2019-12-04 DIAGNOSIS — I10 Essential (primary) hypertension: Secondary | ICD-10-CM

## 2019-12-04 DIAGNOSIS — Z79899 Other long term (current) drug therapy: Secondary | ICD-10-CM

## 2019-12-04 MED ORDER — HYDROCHLOROTHIAZIDE 25 MG PO TABS: ORAL_TABLET | ORAL | 5 refills | Status: DC

## 2019-12-04 MED ORDER — BIKTARVY 50-200-25 MG PO TABS: 1.0000 | ORAL_TABLET | Freq: Every day | ORAL | 5 refills | Status: DC

## 2019-12-04 NOTE — Assessment & Plan Note (Signed)
Blood pressure elevated above goal 140/90 today and Chad [name] reports his blood pressure is usually well controlled and has not taken his medication today.  He has no current red flag symptoms that would be concerning.  Encouraged to continue current dose of hydrochlorothiazide and monitor blood pressure at home as able.

## 2019-12-04 NOTE — Patient Instructions (Signed)
Nice to see you.  Continue to take your Biktarvy as prescribed  Refills will be sent to the pharmacy.   Plan for follow up in 4 months or sooner if needed with lab work 1-2 weeks prior to appointment.  Good luck with your career searches.  Have a great day and stay safe!  Happy New Year!

## 2019-12-04 NOTE — Progress Notes (Signed)
Subjective:    Patient ID: Chad [name], male    DOB: 20-Nov-1971, 49 y.o.   MRN: [id number]  Chief Complaint  Patient presents with  . Follow-up    offered condoms; received flu shot; reports stress at work but feels he has good support; offered counseling services at [facility]; no other complaints     HPI:  Chad [name] is a 49 y.o. male with HIV disease who was last seen in the office on 06/30/2019 with good adherence and tolerance to his ART regimen of Biktarvy. Viral load at the time was undetectable and CD4 count was 1414.  Most recent blood work completed on 10/31/2019 with viral load that remains undetectable and CD4 count of 2164.  RPR titer positive at 1: 4 with previous treatment at 1: 16 indicating successful treatment.  Kidney function, liver function, electrolytes within normal ranges.  Healthcare maintenance due includes dental screening as well as a tetanus vaccination.  Chad [name] continues to take his Biktarvy as prescribed no adverse side effects or missed doses.  Overall feeling well today with no new concerns/complaints. Denies fevers, chills, night sweats, headaches, changes in vision, neck pain/stiffness, nausea, diarrhea, vomiting, lesions or rashes.  Chad [name] continues to be employed at the [employer] and has not covered through [employer] and makes too much money for financial assistance at the present time.  He has no problems obtaining his medication from the pharmacy.  Has had feelings of being down/depressed at times and believes it may be related to his work.  Denies any recreational or illicit drug use or alcohol consumption at present.  Not currently sexually active.  Considering going back to school for a doctoral degree.   Allergies  Allergen Reactions  . Penicillins Other (See Comments)    Other Reaction: mild hives in remote past      Outpatient Medications Prior to Visit  Medication Sig Dispense Refill  . BIKTARVY 50-200-25 MG  TABS tablet TAKE 1 TABLET BY MOUTH DAILY. 30 tablet 5  . hydrochlorothiazide (HYDRODIURIL) 25 MG tablet TAKE 1 TABLET(25 MG) BY MOUTH DAILY 30 tablet 5  . ibuprofen (ADVIL,MOTRIN) 800 MG tablet Take 1 tablet (800 mg total) by mouth every 8 (eight) hours as needed. (Patient not taking: Reported on 12/04/2019) 21 tablet 0   No facility-administered medications prior to visit.     Past Medical History:  Diagnosis Date  . HIV (human immunodeficiency virus infection) ([place])   . Tuberculosis      Past Surgical History:  Procedure Laterality Date  . HERNIA REPAIR    . TONSILLECTOMY         Review of Systems  Constitutional: Negative for appetite change, chills, fatigue, fever and unexpected weight change.  Eyes: Negative for visual disturbance.  Respiratory: Negative for cough, chest tightness, shortness of breath and wheezing.   Cardiovascular: Negative for chest pain and leg swelling.  Gastrointestinal: Negative for abdominal pain, constipation, diarrhea, nausea and vomiting.  Genitourinary: Negative for dysuria, flank pain, frequency, genital sores, hematuria and urgency.  Skin: Negative for rash.  Allergic/Immunologic: Negative for immunocompromised state.  Neurological: Negative for dizziness and headaches.      Objective:    BP (!) 167/111   Pulse 98   Wt 215 lb 3.2 oz (97.6 kg)   BMI 31.78 kg/m  Nursing note and vital signs reviewed.  Physical Exam Constitutional:      General: He is not in acute distress.    Appearance: He is well-developed.  Cardiovascular:  Rate and Rhythm: Normal rate and regular rhythm.     Heart sounds: Normal heart sounds.  Pulmonary:     Effort: Pulmonary effort is normal.     Breath sounds: Normal breath sounds.  Skin:    General: Skin is warm and dry.  Neurological:     Mental Status: He is alert and oriented to person, place, and time.  Psychiatric:        Behavior: Behavior normal.        Thought Content: Thought content  normal.        Judgment: Judgment normal.      Depression screen [facility] 2/9 12/04/2019 10/28/2018 07/30/2018 05/09/2018 04/09/2018  Decreased Interest 0 0 0 0 0  Down, Depressed, Hopeless 0 0 0 0 0  PHQ - 2 Score 0 0 0 0 0       Assessment & Plan:    Patient Active Problem List   Diagnosis Date Noted  . Situational anxiety 02/11/2019  . Syphilis 02/11/2019  . Healthcare maintenance 10/28/2018  . Tobacco use 10/28/2018  . Essential hypertension 07/30/2018  . HIV disease (HCC) 04/08/2018     Problem List Items Addressed This Visit      Cardiovascular and Mediastinum   Essential hypertension    Blood pressure elevated above goal 140/90 today and Mr. Ahlers reports his blood pressure is usually well controlled and has not taken his medication today.  He has no current red flag symptoms that would be concerning.  Encouraged to continue current dose of hydrochlorothiazide and monitor blood pressure at home as able.      Relevant Medications   hydrochlorothiazide (HYDRODIURIL) 25 MG tablet     Other   HIV disease (HCC) - Primary    Chad [name] has well-controlled HIV disease with good adherence and tolerance to his ART regimen of Biktarvy.  No current signs/symptoms of opportunistic infection or progressive HIV disease.  We reviewed lab work and discussed the plan of care with time for questions.  He is meeting with our financial counselors today.  Continue current dose of Biktarvy.  Plan for follow-up in 4 months or sooner if needed with lab work 1 to 2 weeks prior to appointment.      Relevant Medications   bictegravir-emtricitabine-tenofovir AF (BIKTARVY) 50-200-25 MG TABS tablet   Healthcare maintenance     Declines tetanus vaccination today.  Due for dental screening/cleaning with referral placed  Discussed importance of safe sexual practice to reduce risk of STI.  Condoms provided.      Syphilis    Chad [name] has successfully completed treatment for syphilis with titer  of 1: 4 down from 1: 16 at treatment.  No current symptoms.  Continue to monitor RPR periodically.      Relevant Medications   bictegravir-emtricitabine-tenofovir AF (BIKTARVY) 50-200-25 MG TABS tablet       I have discontinued Chad [name]'s ibuprofen. I have also changed his Biktarvy. Additionally, I am having him maintain his hydrochlorothiazide.   Meds ordered this encounter  Medications  . bictegravir-emtricitabine-tenofovir AF (BIKTARVY) 50-200-25 MG TABS tablet    Sig: Take 1 tablet by mouth daily.    Dispense:  30 tablet    Refill:  5    Order Specific Question:   Supervising Provider    Answer:   Judyann Munson [[id number]]  . hydrochlorothiazide (HYDRODIURIL) 25 MG tablet    Sig: TAKE 1 TABLET(25 MG) BY MOUTH DAILY    Dispense:  30 tablet    Refill:  5    Order Specific Question:   Supervising Provider    Answer:   Judyann Munson [[id number]]     Follow-up: Return in about 4 months (around 04/02/2020), or if symptoms worsen or fail to improve.   Marcos Eke, MSN, FNP-C Nurse Practitioner [facility] Main number: [phone]

## 2019-12-04 NOTE — Assessment & Plan Note (Signed)
   Declines tetanus vaccination today.  Due for dental screening/cleaning with referral placed  Discussed importance of safe sexual practice to reduce risk of STI.  Condoms provided.

## 2019-12-04 NOTE — Assessment & Plan Note (Signed)
Chad [name] has successfully completed treatment for syphilis with titer of 1: 4 down from 1: 16 at treatment.  No current symptoms.  Continue to monitor RPR periodically.

## 2019-12-04 NOTE — Assessment & Plan Note (Signed)
[name] has well-controlled HIV disease with good adherence and tolerance to his ART regimen of Biktarvy.  No current signs/symptoms of opportunistic infection or progressive HIV disease.  We reviewed lab work and discussed the plan of care with time for questions.  He is meeting with our financial counselors today.  Continue current dose of Biktarvy.  Plan for follow-up in 4 months or sooner if needed with lab work 1 to 2 weeks prior to appointment.

## 2019-12-31 MED FILL — BIKTARVY 50-200-25 MG TABS: 50-200-25 | 30 days supply | Qty: 30 | Fill #3

## 2020-01-01 ENCOUNTER — Encounter: Payer: Self-pay | Admitting: Infectious Diseases

## 2020-01-28 MED FILL — BIKTARVY 50-200-25 MG TABS: 50-200-25 | 30 days supply | Qty: 30 | Fill #4

## 2020-02-19 MED FILL — BIKTARVY 50-200-25 MG TABS: 50-200-25 | 30 days supply | Qty: 30 | Fill #5

## 2020-03-17 ENCOUNTER — Other Ambulatory Visit: Payer: Self-pay | Admitting: Family

## 2020-03-17 DIAGNOSIS — B20 Human immunodeficiency virus [HIV] disease: Secondary | ICD-10-CM

## 2020-03-18 MED FILL — BIKTARVY 50-200-25 MG TABS: 50-200-25 | 30 days supply | Qty: 30 | Fill #0

## 2020-03-27 ENCOUNTER — Other Ambulatory Visit: Payer: Self-pay | Admitting: Family

## 2020-03-27 DIAGNOSIS — I10 Essential (primary) hypertension: Secondary | ICD-10-CM

## 2020-04-02 ENCOUNTER — Other Ambulatory Visit: Payer: Self-pay

## 2020-04-06 ENCOUNTER — Other Ambulatory Visit: Payer: Self-pay

## 2020-04-14 ENCOUNTER — Other Ambulatory Visit: Payer: Self-pay | Admitting: Family

## 2020-04-14 DIAGNOSIS — B20 Human immunodeficiency virus [HIV] disease: Secondary | ICD-10-CM

## 2020-04-15 MED FILL — BIKTARVY 50-200-25 MG TABS: 50-200-25 | 30 days supply | Qty: 30 | Fill #0

## 2020-04-16 ENCOUNTER — Encounter: Payer: Self-pay | Admitting: Family

## 2020-04-28 ENCOUNTER — Other Ambulatory Visit: Payer: No Typology Code available for payment source

## 2020-04-28 ENCOUNTER — Other Ambulatory Visit: Payer: Self-pay

## 2020-04-28 DIAGNOSIS — B20 Human immunodeficiency virus [HIV] disease: Secondary | ICD-10-CM

## 2020-04-28 DIAGNOSIS — Z113 Encounter for screening for infections with a predominantly sexual mode of transmission: Secondary | ICD-10-CM

## 2020-04-28 DIAGNOSIS — Z79899 Other long term (current) drug therapy: Secondary | ICD-10-CM

## 2020-04-29 LAB — T-HELPER CELL (CD4) - (RCID CLINIC ONLY)
CD4 % Helper T Cell: 35 % (ref 33–65)
CD4 T Cell Abs: 1375 /uL (ref 400–1790)

## 2020-04-30 LAB — LIPID PANEL
Cholesterol: 131 mg/dL (ref <200)
HDL: 37 mg/dL — ABNORMAL LOW (ref >=40)
LDL Cholesterol (Calc): 71 mg/dL (calc)
Non-HDL Cholesterol (Calc): 94 mg/dL (calc) (ref <130)
Total CHOL/HDL Ratio: 3.5 (calc) (ref <5.0)
Triglycerides: 149 mg/dL (ref <150)

## 2020-04-30 LAB — COMPREHENSIVE METABOLIC PANEL
AG Ratio: 1 (calc) (ref 1.0–2.5)
ALT: 25 U/L (ref 9–46)
AST: 30 U/L (ref 10–40)
Albumin: 4 g/dL (ref 3.6–5.1)
Alkaline phosphatase (APISO): 77 U/L (ref 36–130)
BUN: 11 mg/dL (ref 7–25)
CO2: 26 mmol/L (ref 20–32)
Calcium: 8.8 mg/dL (ref 8.6–10.3)
Chloride: 103 mmol/L (ref 98–110)
Creat: 0.89 mg/dL (ref 0.60–1.35)
Globulin: 4.2 g/dL (calc) — ABNORMAL HIGH (ref 1.9–3.7)
Glucose, Bld: 122 mg/dL — ABNORMAL HIGH (ref 65–99)
Potassium: 3.8 mmol/L (ref 3.5–5.3)
Sodium: 137 mmol/L (ref 135–146)
Total Bilirubin: 0.5 mg/dL (ref 0.2–1.2)
Total Protein: 8.2 g/dL — ABNORMAL HIGH (ref 6.1–8.1)

## 2020-04-30 LAB — HIV-1 RNA QUANT-NO REFLEX-BLD
HIV 1 RNA Quant: <20 copies/mL
HIV-1 RNA Quant, Log: <1.3 Log copies/mL

## 2020-04-30 LAB — RPR TITER: RPR Titer: 1:64 {titer} — ABNORMAL HIGH

## 2020-04-30 LAB — RPR: RPR Ser Ql: REACTIVE — AB

## 2020-04-30 LAB — FLUORESCENT TREPONEMAL AB(FTA)-IGG-BLD: Fluorescent Treponemal ABS: REACTIVE — AB

## 2020-05-06 ENCOUNTER — Telehealth: Payer: Self-pay

## 2020-05-06 NOTE — Telephone Encounter (Signed)
Called patient to give him lab results. No identifying voicemail. No message left. Chad [name] T Pricilla Loveless

## 2020-05-07 ENCOUNTER — Other Ambulatory Visit: Payer: Self-pay

## 2020-05-07 ENCOUNTER — Telehealth: Payer: Self-pay

## 2020-05-07 ENCOUNTER — Other Ambulatory Visit: Payer: Self-pay | Admitting: Family

## 2020-05-07 DIAGNOSIS — B20 Human immunodeficiency virus [HIV] disease: Secondary | ICD-10-CM

## 2020-05-07 DIAGNOSIS — A539 Syphilis, unspecified: Secondary | ICD-10-CM

## 2020-05-07 MED ORDER — DOXYCYCLINE HYCLATE 100 MG PO TABS: 100.0000 mg | ORAL_TABLET | Freq: Two times a day (BID) | ORAL | 0 refills | Status: AC

## 2020-05-07 NOTE — Telephone Encounter (Signed)
COVID-19 Pre-Screening Questions:05/07/20  Do you currently have a fever (>100 F), chills or unexplained body aches? NO  Are you currently experiencing new cough, shortness of breath, sore throat, runny nose? NO .  Have you recently travelled outside the state of [place] in the last 14 days? NO  .  Have you been in contact with someone that is currently pending confirmation of Covid19 testing or has been confirmed to have the Covid19 virus?  NO  **If the patient answers NO to ALL questions -  advise the patient to please call the clinic before coming to the office should any symptoms develop.     

## 2020-05-07 NOTE — Telephone Encounter (Signed)
Called patient regarding positive RPR. Was able to reach patient; and review results. Patient is okay with starting doxycyline. States he is not sure how he is reinfected since is abstaining from sexual activity.  Advised he continue to refrain sexual activity until he is completely treated.  Informed patient that health department will be reaching out to him to discuss contacts. Lorenso Courier, [place]

## 2020-05-10 ENCOUNTER — Encounter: Payer: Self-pay | Admitting: Family

## 2020-05-10 ENCOUNTER — Ambulatory Visit (INDEPENDENT_AMBULATORY_CARE_PROVIDER_SITE_OTHER): Payer: No Typology Code available for payment source | Admitting: Family

## 2020-05-10 ENCOUNTER — Other Ambulatory Visit: Payer: Self-pay

## 2020-05-10 ENCOUNTER — Other Ambulatory Visit: Payer: Self-pay | Admitting: Family

## 2020-05-10 VITALS — BP 129/88 | HR 114 | Temp 98.7°F | Ht 69.0 in | Wt 206.0 lb

## 2020-05-10 DIAGNOSIS — B20 Human immunodeficiency virus [HIV] disease: Secondary | ICD-10-CM | POA: Diagnosis not present

## 2020-05-10 DIAGNOSIS — A539 Syphilis, unspecified: Secondary | ICD-10-CM

## 2020-05-10 DIAGNOSIS — Z0000 Encounter for general adult medical examination without abnormal findings: Secondary | ICD-10-CM | POA: Diagnosis not present

## 2020-05-10 MED ORDER — BIKTARVY 50-200-25 MG PO TABS: 1.0000 | ORAL_TABLET | Freq: Every day | ORAL | 5 refills | Status: DC

## 2020-05-10 NOTE — Assessment & Plan Note (Signed)
   Discussed importance of safe sexual practice to reduce risk of STI.  Condoms provided.  Encouraged to complete dental exam with routine dental cleanings.

## 2020-05-10 NOTE — Assessment & Plan Note (Signed)
RPR titer increased from 1: 16 now 1: 64.  Prescribed doxycycline.  No current rashes.  Continue to take doxycycline until completed.

## 2020-05-10 NOTE — Assessment & Plan Note (Signed)
Mr. Rupe continues to have well-controlled HIV disease with good adherence and tolerance to his ART regimen of Biktarvy.  No signs/symptoms of opportunistic infection or progressive HIV disease.  We reviewed his lab work and discussed the plan of care.  Continue current dose of Biktarvy.  Plan for follow-up in 4 months or sooner if needed with lab work 1 to 2 weeks prior to appointment.

## 2020-05-10 NOTE — Patient Instructions (Addendum)
Nice to see you.  Continue to take your Biktarvy daily as prescribed.  Refills have been sent to the pharmacy.   Plan for follow up in 4 months or sooner if needed with lab work 1-2 weeks prior to your appointment.   Have a great day and stay safe! 

## 2020-05-10 NOTE — Progress Notes (Signed)
Subjective:    Patient ID: Chad [name], male    DOB: 1971/08/05, 49 y.o.   MRN: [id number]  Chief Complaint  Patient presents with   Follow-up    B20     HPI:  Chad [name] is a 49 y.o. male with HIV disease who was last seen in the office on 12/04/2019 with good adherence and tolerance to his ART regimen of Biktarvy.  Previous lab work showed a viral load that was undetectable and CD4 count of 2164.  Most recent blood work completed on 04/28/2020 with viral load that remains undetectable and CD4 count of 1375.  RPR titer increased from 1: 16 to 1: 64 with concern for new syphilis infection. Here today for routine follow-up.  Chad [name] continues to take his Biktarvy as prescribed with no adverse side effects or missed doses.  Expresses frustration secondary to new recurrent diagnosis of syphilis.  Otherwise feeling well with no new concerns/complaints. Denies fevers, chills, night sweats, headaches, changes in vision, neck pain/stiffness, nausea, diarrhea, vomiting, lesions or rashes.  Chad [name] has no problems obtaining his medication from the pharmacy.  Denies feelings of being down, depressed, or hopeless despite the frustrations he is experiencing.  No recreational or illicit drug use, tobacco use, with occasional alcohol consumption.  Requesting condoms.  May be moving for work down to [employer] [facility] [employer]..  Allergies  Allergen Reactions   Penicillins Other (See Comments)    Other Reaction: mild hives in remote past      Outpatient Medications Prior to Visit  Medication Sig Dispense Refill   doxycycline (VIBRA-TABS) 100 MG tablet Take 1 tablet (100 mg total) by mouth 2 (two) times daily for 14 days. 28 tablet 0   hydrochlorothiazide (HYDRODIURIL) 25 MG tablet TAKE 1 TABLET(25 MG) BY MOUTH DAILY 30 tablet 5   BIKTARVY 50-200-25 MG TABS tablet TAKE 1 TABLET BY MOUTH DAILY. 30 tablet 0   No facility-administered medications prior to visit.     Past Medical  History:  Diagnosis Date   HIV (human immunodeficiency virus infection) (HCC)    Tuberculosis      Past Surgical History:  Procedure Laterality Date   HERNIA REPAIR     TONSILLECTOMY         Review of Systems  Constitutional: Negative for appetite change, chills, fatigue, fever and unexpected weight change.  Eyes: Negative for visual disturbance.  Respiratory: Negative for cough, chest tightness, shortness of breath and wheezing.   Cardiovascular: Negative for chest pain and leg swelling.  Gastrointestinal: Negative for abdominal pain, constipation, diarrhea, nausea and vomiting.  Genitourinary: Negative for dysuria, flank pain, frequency, genital sores, hematuria and urgency.  Skin: Negative for rash.  Allergic/Immunologic: Negative for immunocompromised state.  Neurological: Negative for dizziness and headaches.      Objective:    BP 129/88    Pulse (!) 114    Temp 98.7 F (37.1 C)    Ht 5\' 9"  (1.753 m)    Wt 206 lb (93.4 kg)    SpO2 96%    BMI 30.42 kg/m  Nursing note and vital signs reviewed.  Physical Exam Constitutional:      General: He is not in acute distress.    Appearance: He is well-developed.  Eyes:     Conjunctiva/sclera: Conjunctivae normal.  Cardiovascular:     Rate and Rhythm: Normal rate and regular rhythm.     Heart sounds: Normal heart sounds. No murmur heard.  No friction rub. No gallop.   Pulmonary:  Effort: Pulmonary effort is normal. No respiratory distress.     Breath sounds: Normal breath sounds. No wheezing or rales.  Chest:     Chest wall: No tenderness.  Abdominal:     General: Bowel sounds are normal.     Palpations: Abdomen is soft.     Tenderness: There is no abdominal tenderness.  Musculoskeletal:     Cervical back: Neck supple.  Lymphadenopathy:     Cervical: No cervical adenopathy.  Skin:    General: Skin is warm and dry.     Findings: No rash.  Neurological:     Mental Status: He is alert and oriented to person,  place, and time.  Psychiatric:        Behavior: Behavior normal.        Thought Content: Thought content normal.        Judgment: Judgment normal.      Depression screen [facility] 2/9 05/10/2020 12/04/2019 10/28/2018 07/30/2018 05/09/2018  Decreased Interest 0 0 0 0 0  Down, Depressed, Hopeless 0 0 0 0 0  PHQ - 2 Score 0 0 0 0 0       Assessment & Plan:    Patient Active Problem List   Diagnosis Date Noted   Situational anxiety 02/11/2019   Syphilis 02/11/2019   Healthcare maintenance 10/28/2018   Tobacco use 10/28/2018   Essential hypertension 07/30/2018   HIV disease ([place]) 04/08/2018     Problem List Items Addressed This Visit      Other   HIV disease ([place]) - Primary    Chad [name] continues to have well-controlled HIV disease with good adherence and tolerance to his ART regimen of Biktarvy.  No signs/symptoms of opportunistic infection or progressive HIV disease.  We reviewed his lab work and discussed the plan of care.  Continue current dose of Biktarvy.  Plan for follow-up in 4 months or sooner if needed with lab work 1 to 2 weeks prior to appointment.      Relevant Medications   bictegravir-emtricitabine-tenofovir AF (BIKTARVY) 50-200-25 MG TABS tablet   Other Relevant Orders   HIV-1 RNA quant-no reflex-bld   T-helper cell (CD4)- ([facility] clinic only)   Comprehensive metabolic panel   Healthcare maintenance     Discussed importance of safe sexual practice to reduce risk of STI.  Condoms provided.  Encouraged to complete dental exam with routine dental cleanings.      Syphilis    RPR titer increased from 1: 16 now 1: 64.  Prescribed doxycycline.  No current rashes.  Continue to take doxycycline until completed.      Relevant Medications   bictegravir-emtricitabine-tenofovir AF (BIKTARVY) 50-200-25 MG TABS tablet   Other Relevant Orders   RPR       I have changed [employer]. I am also having him maintain his hydrochlorothiazide and  doxycycline.   Meds ordered this encounter  Medications   bictegravir-emtricitabine-tenofovir AF (BIKTARVY) 50-200-25 MG TABS tablet    Sig: Take 1 tablet by mouth daily.    Dispense:  30 tablet    Refill:  5    Order Specific Question:   Supervising Provider    Answer:   Chad Basques [[id number]]     Follow-up: Return in about 4 months (around 09/09/2020), or if symptoms worsen or fail to improve.   Terri Piedra, MSN, FNP-C Nurse Practitioner [facility] [place] number: [phone]

## 2020-05-11 MED FILL — BIKTARVY 50-200-25 MG TABS: 50-200-25 | 30 days supply | Qty: 30 | Fill #0

## 2020-05-25 ENCOUNTER — Telehealth: Payer: Self-pay

## 2020-05-25 NOTE — Telephone Encounter (Signed)
Received call today from [place] with [facility]  regarding issues contacting the patient. States she has made multiple attempts to get contact info from patient, but has not had any success.  Would like office to call patient call her as soon as he can. Called patient and requested he return [facility] call. Provided contact information to follow up. Erie Noe, [place] [phone] Lorenso Courier, CMA

## 2020-06-08 MED FILL — BIKTARVY 50-200-25 MG TABS: 50-200-25 | 30 days supply | Qty: 30 | Fill #0

## 2020-07-05 MED FILL — BIKTARVY 50-200-25 MG TABS: 50-200-25 | 30 days supply | Qty: 30 | Fill #1

## 2020-08-03 MED FILL — BIKTARVY 50-200-25 MG TABS: 50-200-25 | 30 days supply | Qty: 30 | Fill #2

## 2020-08-26 ENCOUNTER — Ambulatory Visit: Payer: Self-pay

## 2020-08-26 ENCOUNTER — Other Ambulatory Visit: Payer: Self-pay

## 2020-08-26 DIAGNOSIS — B20 Human immunodeficiency virus [HIV] disease: Secondary | ICD-10-CM

## 2020-08-26 DIAGNOSIS — A539 Syphilis, unspecified: Secondary | ICD-10-CM

## 2020-08-27 LAB — T-HELPER CELL (CD4) - (RCID CLINIC ONLY)
CD4 % Helper T Cell: 36 % (ref 33–65)
CD4 T Cell Abs: 1547 /uL (ref 400–1790)

## 2020-08-31 LAB — FLUORESCENT TREPONEMAL AB(FTA)-IGG-BLD: Fluorescent Treponemal ABS: REACTIVE — AB

## 2020-08-31 LAB — HIV-1 RNA QUANT-NO REFLEX-BLD
HIV 1 RNA Quant: <20 Copies/mL
HIV-1 RNA Quant, Log: <1.3 Log cps/mL

## 2020-08-31 LAB — COMPREHENSIVE METABOLIC PANEL
AG Ratio: 1.1 (calc) (ref 1.0–2.5)
ALT: 30 U/L (ref 9–46)
AST: 31 U/L (ref 10–40)
Albumin: 4 g/dL (ref 3.6–5.1)
Alkaline phosphatase (APISO): 65 U/L (ref 36–130)
BUN: 8 mg/dL (ref 7–25)
CO2: 25 mmol/L (ref 20–32)
Calcium: 9 mg/dL (ref 8.6–10.3)
Chloride: 102 mmol/L (ref 98–110)
Creat: 0.81 mg/dL (ref 0.60–1.35)
Globulin: 3.7 g/dL (calc) (ref 1.9–3.7)
Glucose, Bld: 127 mg/dL — ABNORMAL HIGH (ref 65–99)
Potassium: 4 mmol/L (ref 3.5–5.3)
Sodium: 135 mmol/L (ref 135–146)
Total Bilirubin: 0.5 mg/dL (ref 0.2–1.2)
Total Protein: 7.7 g/dL (ref 6.1–8.1)

## 2020-08-31 LAB — RPR TITER: RPR Titer: 1:32 {titer} — ABNORMAL HIGH

## 2020-08-31 LAB — RPR: RPR Ser Ql: REACTIVE — AB

## 2020-08-31 MED FILL — BIKTARVY 50-200-25 MG TABS: 50-200-25 | 30 days supply | Qty: 30 | Fill #3

## 2020-09-09 ENCOUNTER — Encounter: Payer: Self-pay | Admitting: Family

## 2020-09-09 ENCOUNTER — Other Ambulatory Visit: Payer: Self-pay

## 2020-09-09 ENCOUNTER — Telehealth (INDEPENDENT_AMBULATORY_CARE_PROVIDER_SITE_OTHER): Payer: No Typology Code available for payment source | Admitting: Family

## 2020-09-09 DIAGNOSIS — I10 Essential (primary) hypertension: Secondary | ICD-10-CM

## 2020-09-09 DIAGNOSIS — Z0000 Encounter for general adult medical examination without abnormal findings: Secondary | ICD-10-CM

## 2020-09-09 DIAGNOSIS — Z79899 Other long term (current) drug therapy: Secondary | ICD-10-CM

## 2020-09-09 DIAGNOSIS — A539 Syphilis, unspecified: Secondary | ICD-10-CM

## 2020-09-09 DIAGNOSIS — B20 Human immunodeficiency virus [HIV] disease: Secondary | ICD-10-CM

## 2020-09-09 MED ORDER — HYDROCHLOROTHIAZIDE 25 MG PO TABS: 25.0000 mg | ORAL_TABLET | Freq: Every day | ORAL | 5 refills | Status: DC

## 2020-09-09 MED ORDER — BIKTARVY 50-200-25 MG PO TABS: 1.0000 | ORAL_TABLET | Freq: Every day | ORAL | 5 refills | Status: DC

## 2020-09-09 NOTE — Assessment & Plan Note (Signed)
Chad [name] continues to have well-controlled HIV disease with good adherence and tolerance to his ART regimen of Biktarvy.  No signs/symptoms of opportunistic infection or progressive HIV disease.  We reviewed previous lab work and discussed plan of care.  Continue current dose of Biktarvy.  Plan for follow-up in 3 months or sooner if needed with lab work 1 to 2 weeks prior to appointment.

## 2020-09-09 NOTE — Patient Instructions (Signed)
Nice to speak with you.  Glad everything is going well for you.  Continue take your Biktarvy and hydrochlorothiazide daily as prescribed.  Refills of been sent to the pharmacy.  Plan for follow-up in 3 months or sooner if needed with lab work 1 to 2 weeks prior to appointment.  Have a great day and stay safe!

## 2020-09-09 NOTE — Assessment & Plan Note (Signed)
Blood pressure appears adequately controlled with current dose of hydrochlorothiazide.  Denies any headaches, blurred vision, or changes in vision.  Monitoring blood pressure at home as able and working on low-sodium diet.  Continue current dose of hydrochlorothiazide.

## 2020-09-09 NOTE — Assessment & Plan Note (Signed)
RPR improved from 1: 64 down to 1: 32.  Will need to reach 1: 16 for confirmation of successful treatment.  If remains elevated in the next 3 months will need retreatment.  No current symptoms.

## 2020-09-09 NOTE — Assessment & Plan Note (Signed)
   Routine dental care up-to-date per recommendations.  Discussed importance of safe sexual practice to reduce risk of STI.  Encouraged/discussed Covid booster as well as influenza vaccine.

## 2020-09-09 NOTE — Progress Notes (Signed)
Subjective:    Patient ID: Chad [name], male    DOB: 20-Mar-1971, 49 y.o.   MRN: [id number]  No chief complaint on file.    Virtual Visit via Telephone/Video Note   I connected with Chad [name] on 09/09/2020 at 10:15 AM by telephone and verified that I am speaking with the correct person using two identifiers.   I discussed the limitations, risks, security and privacy concerns of performing an evaluation and management service by telephone and the availability of in person appointments. I also discussed with the patient that there may be a patient responsible charge related to this service. The patient expressed understanding and agreed to proceed.  Location:  Patient: Home Provider: Clinic   HPI:  Chad [name] is a 49 y.o. male with HIV disease who was last seen in the office on 05/10/2020 with good adherence and tolerance to his ART regimen of Biktarvy. Viral load at the time was found to be undetectable with CD4 count of 1375. RPR was also positive for syphilis with a titer of 1: 64. Most recent blood work completed on 08/26/2020 with a viral load that remains undetectable and CD4 count of 1547. RPR improved to 1: 32. Kidney function, liver function, electrolytes within normal ranges. Televisit today for routine follow-up.  Chad [name] continues to take his Biktarvy daily as prescribed with no adverse side effects or missed doses since his last office visit.  Overall feeling well today with no new concerns/complaints. Denies fevers, chills, night sweats, headaches, changes in vision, neck pain/stiffness, nausea, diarrhea, vomiting, lesions or rashes.  Chad [name] has no problems obtaining his medication from the pharmacy.  Denies feelings of being down, depressed, or hopeless recently.  No recreational or illicit drug use with occasional alcohol and smoking approximately 1/3 pack/day on average.  Remains in a monogamous relationship.  Scheduling his booster dose of  Pfizer vaccine will get flu shot.  Routine dental care is up-to-date per recommendations.   Allergies  Allergen Reactions  . Penicillins Other (See Comments)    Other Reaction: mild hives in remote past      Outpatient Medications Prior to Visit  Medication Sig Dispense Refill  . bictegravir-emtricitabine-tenofovir AF (BIKTARVY) 50-200-25 MG TABS tablet Take 1 tablet by mouth daily. 30 tablet 5  . hydrochlorothiazide (HYDRODIURIL) 25 MG tablet TAKE 1 TABLET(25 MG) BY MOUTH DAILY 30 tablet 5   No facility-administered medications prior to visit.     Past Medical History:  Diagnosis Date  . HIV (human immunodeficiency virus infection) (HCC)   . Tuberculosis      Past Surgical History:  Procedure Laterality Date  . HERNIA REPAIR    . TONSILLECTOMY      Review of Systems  Constitutional: Negative for appetite change, chills, fatigue, fever and unexpected weight change.  Eyes: Negative for visual disturbance.  Respiratory: Negative for cough, chest tightness, shortness of breath and wheezing.   Cardiovascular: Negative for chest pain and leg swelling.  Gastrointestinal: Negative for abdominal pain, constipation, diarrhea, nausea and vomiting.  Genitourinary: Negative for dysuria, flank pain, frequency, genital sores, hematuria and urgency.  Skin: Negative for rash.  Allergic/Immunologic: Negative for immunocompromised state.  Neurological: Negative for dizziness and headaches.      Objective:    Nursing note and vital signs reviewed.  Chad [name] is pleasant to speak with.  He sounds to be doing well.  Physical exam limited secondary to telehealth visit.     Assessment & Plan:   Problem List  Items Addressed This Visit      Cardiovascular and Mediastinum   Essential hypertension    Blood pressure appears adequately controlled with current dose of hydrochlorothiazide.  Denies any headaches, blurred vision, or changes in vision.  Monitoring blood pressure at home as  able and working on low-sodium diet.  Continue current dose of hydrochlorothiazide.      Relevant Medications   hydrochlorothiazide (HYDRODIURIL) 25 MG tablet     Other   HIV disease (HCC) - Primary    Chad [name] continues to have well-controlled HIV disease with good adherence and tolerance to his ART regimen of Biktarvy.  No signs/symptoms of opportunistic infection or progressive HIV disease.  We reviewed previous lab work and discussed plan of care.  Continue current dose of Biktarvy.  Plan for follow-up in 3 months or sooner if needed with lab work 1 to 2 weeks prior to appointment.      Relevant Medications   bictegravir-emtricitabine-tenofovir AF (BIKTARVY) 50-200-25 MG TABS tablet   Other Relevant Orders   HIV-1 RNA quant-no reflex-bld   T-helper cell (CD4)- ([facility] clinic only)   Comprehensive metabolic panel   Healthcare maintenance     Routine dental care up-to-date per recommendations.  Discussed importance of safe sexual practice to reduce risk of STI.  Encouraged/discussed Covid booster as well as influenza vaccine.      Syphilis    RPR improved from 1: 64 down to 1: 32.  Will need to reach 1: 16 for confirmation of successful treatment.  If remains elevated in the next 3 months will need retreatment.  No current symptoms.      Relevant Medications   bictegravir-emtricitabine-tenofovir AF (BIKTARVY) 50-200-25 MG TABS tablet   Other Relevant Orders   RPR    Other Visit Diagnoses    Pharmacologic therapy       Relevant Orders   Lipid panel       I have changed Chad [name]'s hydrochlorothiazide. I am also having him maintain his Biktarvy.   Meds ordered this encounter  Medications  . bictegravir-emtricitabine-tenofovir AF (BIKTARVY) 50-200-25 MG TABS tablet    Sig: Take 1 tablet by mouth daily.    Dispense:  30 tablet    Refill:  5    Order Specific Question:   Supervising Provider    Answer:   Judyann Munson [[id number]]  . hydrochlorothiazide  (HYDRODIURIL) 25 MG tablet    Sig: Take 1 tablet (25 mg total) by mouth daily.    Dispense:  30 tablet    Refill:  5    Order Specific Question:   Supervising Provider    Answer:   Judyann Munson [phone]    I discussed the assessment and treatment plan with the patient. The patient was provided an opportunity to ask questions and all were answered. The patient agreed with the plan and demonstrated an understanding of the instructions.   The patient was advised to call back or seek an in-person evaluation if the symptoms worsen or if the condition fails to improve as anticipated.   I provided 12  minutes of non-face-to-face time during this encounter.  Follow-up: Return in about 3 months (around 12/10/2020), or if symptoms worsen or fail to improve.   Marcos Eke, MSN, FNP-C Nurse Practitioner [facility]ious Disease [facility] Main number: [phone]

## 2020-09-27 MED FILL — BIKTARVY 50-200-25 MG TABS: 50-200-25 | 30 days supply | Qty: 30 | Fill #4

## 2020-10-17 ENCOUNTER — Other Ambulatory Visit: Payer: Self-pay | Admitting: Family

## 2020-10-17 DIAGNOSIS — I10 Essential (primary) hypertension: Secondary | ICD-10-CM

## 2020-10-20 MED FILL — BIKTARVY 50-200-25 MG TABS: 50-200-25 | 30 days supply | Qty: 30 | Fill #5

## 2020-12-02 ENCOUNTER — Other Ambulatory Visit: Payer: Self-pay | Admitting: Family

## 2020-12-02 DIAGNOSIS — B20 Human immunodeficiency virus [HIV] disease: Secondary | ICD-10-CM

## 2020-12-20 ENCOUNTER — Ambulatory Visit: Payer: Self-pay | Admitting: Family

## 2020-12-20 ENCOUNTER — Other Ambulatory Visit: Payer: Self-pay

## 2021-01-03 ENCOUNTER — Telehealth: Payer: Self-pay

## 2021-01-03 MED FILL — BIKTARVY 50-200-25 MG TABS: 50-200-25 | 30 days supply | Qty: 30 | Fill #0

## 2021-01-03 NOTE — Telephone Encounter (Signed)
[facility] Patient Advocate Encounter  Completed and sent [employer] Access application for Biktarvy for this patient who is uninsured.    Patient assistance phone number for follow up is [phone].   This encounter will be updated until final determination.,   Lenay Lovejoy, CPhT Specialty Pharmacy Patient [facility] Phone: [phone] Fax:  [phone]  

## 2021-01-03 NOTE — Telephone Encounter (Signed)
[facility] Patient Advocate Encounter  Completed and sent [employer] application for Biktarvy  for this patient who is uninsured.    Patient is approved 01/03/21 through 11/26/21.  BIN      [id number]  PCN    [id number] [id number]    [id number] ID        [id number]   Clearance Coots, CPhT Specialty Pharmacy Patient [facility] Phone: [phone] Fax:  [phone]

## 2021-01-06 ENCOUNTER — Other Ambulatory Visit: Payer: Self-pay

## 2021-01-06 DIAGNOSIS — A539 Syphilis, unspecified: Secondary | ICD-10-CM

## 2021-01-06 DIAGNOSIS — Z79899 Other long term (current) drug therapy: Secondary | ICD-10-CM

## 2021-01-06 DIAGNOSIS — B20 Human immunodeficiency virus [HIV] disease: Secondary | ICD-10-CM

## 2021-01-07 LAB — T-HELPER CELL (CD4) - (RCID CLINIC ONLY)
CD4 % Helper T Cell: 36 % (ref 33–65)
CD4 T Cell Abs: 1625 /uL (ref 400–1790)

## 2021-01-10 LAB — COMPREHENSIVE METABOLIC PANEL
AG Ratio: 1.1 (calc) (ref 1.0–2.5)
ALT: 25 U/L (ref 9–46)
AST: 20 U/L (ref 10–40)
Albumin: 4.2 g/dL (ref 3.6–5.1)
Alkaline phosphatase (APISO): 73 U/L (ref 36–130)
BUN: 15 mg/dL (ref 7–25)
CO2: 27 mmol/L (ref 20–32)
Calcium: 9.3 mg/dL (ref 8.6–10.3)
Chloride: 100 mmol/L (ref 98–110)
Creat: 0.99 mg/dL (ref 0.60–1.35)
Globulin: 3.9 g/dL (calc) — ABNORMAL HIGH (ref 1.9–3.7)
Glucose, Bld: 117 mg/dL — ABNORMAL HIGH (ref 65–99)
Potassium: 3.8 mmol/L (ref 3.5–5.3)
Sodium: 135 mmol/L (ref 135–146)
Total Bilirubin: 0.3 mg/dL (ref 0.2–1.2)
Total Protein: 8.1 g/dL (ref 6.1–8.1)

## 2021-01-10 LAB — LIPID PANEL
Cholesterol: 152 mg/dL (ref <200)
HDL: 32 mg/dL — ABNORMAL LOW (ref >=40)
LDL Cholesterol (Calc): 78 mg/dL (calc)
Non-HDL Cholesterol (Calc): 120 mg/dL (calc) (ref <130)
Total CHOL/HDL Ratio: 4.8 (calc) (ref <5.0)
Triglycerides: 348 mg/dL — ABNORMAL HIGH (ref <150)

## 2021-01-10 LAB — HIV-1 RNA QUANT-NO REFLEX-BLD
HIV 1 RNA Quant: <20 Copies/mL
HIV-1 RNA Quant, Log: <1.3 Log cps/mL

## 2021-01-10 LAB — RPR: RPR Ser Ql: REACTIVE — AB

## 2021-01-10 LAB — RPR TITER: RPR Titer: 1:16 {titer} — ABNORMAL HIGH

## 2021-01-10 LAB — FLUORESCENT TREPONEMAL AB(FTA)-IGG-BLD: Fluorescent Treponemal ABS: REACTIVE — AB

## 2021-01-17 ENCOUNTER — Encounter: Payer: Self-pay | Admitting: Family

## 2021-01-17 ENCOUNTER — Telehealth (INDEPENDENT_AMBULATORY_CARE_PROVIDER_SITE_OTHER): Payer: Self-pay | Admitting: Family

## 2021-01-17 ENCOUNTER — Other Ambulatory Visit: Payer: Self-pay

## 2021-01-17 DIAGNOSIS — A539 Syphilis, unspecified: Secondary | ICD-10-CM

## 2021-01-17 DIAGNOSIS — Z0000 Encounter for general adult medical examination without abnormal findings: Secondary | ICD-10-CM

## 2021-01-17 DIAGNOSIS — I10 Essential (primary) hypertension: Secondary | ICD-10-CM

## 2021-01-17 DIAGNOSIS — B20 Human immunodeficiency virus [HIV] disease: Secondary | ICD-10-CM

## 2021-01-17 MED ORDER — HYDROCHLOROTHIAZIDE 25 MG PO TABS: 25.0000 mg | ORAL_TABLET | Freq: Every day | ORAL | 5 refills | Status: DC

## 2021-01-17 MED ORDER — BIKTARVY 50-200-25 MG PO TABS: 1.0000 | ORAL_TABLET | Freq: Every day | ORAL | 5 refills | Status: DC

## 2021-01-17 NOTE — Assessment & Plan Note (Signed)
Chad [name] continues to have well-controlled HIV disease with good adherence and tolerance to his ART regimen of Biktarvy.  No signs/symptoms of opportunistic infection or progressive HIV.  We reviewed lab work and discussed plan of care.  Continue current dose of Biktarvy.  Plan for follow-up in 4 months or sooner if needed with lab work 1 to 2 weeks prior to appointment.

## 2021-01-17 NOTE — Assessment & Plan Note (Signed)
[name]'s current RPR titer 1: 16 is down from 1: 64 treatment level which indicates successful treatment of syphilis.  No current symptoms.  Encouraged safe sexual practices.  Continue to monitor.

## 2021-01-17 NOTE — Progress Notes (Addendum)
Subjective:    Patient ID: Chad [name], male    DOB: [birth year], 50 y.o.   MRN: [id number]  Chief Complaint  Patient presents with  . HIV Positive/AIDS  . Hypertension     Virtual Visit via Telephone/Video Note   I connected with [name] on 01/17/2021 at 2:30 pm by telephone and verified that I am speaking with the correct person using two identifiers.   I discussed the limitations, risks, security and privacy concerns of performing an evaluation and management service by telephone and the availability of in person appointments. I also discussed with the patient that there may be a patient responsible charge related to this service. The patient expressed understanding and agreed to proceed.  Location:  Patient: Home Provider: Clinic   HPI:  Chad [name] is a 50 y.o. male with HIV disease last in contact through telehealth on 09/09/2020 with well-controlled virus and good adherence and tolerance to his ART regimen of Biktarvy.  Viral load at the time was undetectable CD4 count of 1547.  RPR titer down to 1: 32 from 1: 64.  Most recent blood work completed on 01/06/2021 with viral load that remains undetectable and CD4 count of 1625.  RPR titer continues to downtrend now at 1: 16.  Kidney function, liver function, electrolytes within normal ranges.  Cholesterol with HDL of 32 and triglycerides 348.  Discussion today for routine follow-up.  Chad [name] continues to take his Biktarvy daily as prescribed with no adverse side effects or missed doses since his last office visit.  Recently diagnosed with COVID-19 and currently in quarantine.  Describes mainly flulike symptoms but is overall feeling okay with no shortness of breath or chest pain. Denies  night sweats, headaches, changes in vision, neck pain/stiffness, nausea, diarrhea, vomiting, lesions or rashes.  Chad [name] has no problems obtaining his medication from the pharmacy.  Denies feelings of being down,  depressed, or hopeless recently.  No recreational or illicit drug use and minimal tobacco use.  Alcohol consumption is on occasion.  Recent dental care completed back in August.  Has received Covid vaccines and booster.  Allergies  Allergen Reactions  . Penicillins Other (See Comments)    Other Reaction: mild hives in remote past      Outpatient Medications Prior to Visit  Medication Sig Dispense Refill  . BIKTARVY 50-200-25 MG TABS tablet TAKE 1 TABLET BY MOUTH DAILY. 30 tablet 3  . hydrochlorothiazide (HYDRODIURIL) 25 MG tablet Take 1 tablet (25 mg total) by mouth daily. 30 tablet 5   No facility-administered medications prior to visit.     Past Medical History:  Diagnosis Date  . HIV (human immunodeficiency virus infection) (HCC)   . Tuberculosis      Past Surgical History:  Procedure Laterality Date  . HERNIA REPAIR    . TONSILLECTOMY      Review of Systems  Constitutional: Negative for appetite change, chills, fatigue, fever and unexpected weight change.  Eyes: Negative for visual disturbance.  Respiratory: Negative for cough, chest tightness, shortness of breath and wheezing.   Cardiovascular: Negative for chest pain and leg swelling.  Gastrointestinal: Negative for abdominal pain, constipation, diarrhea, nausea and vomiting.  Genitourinary: Negative for dysuria, flank pain, frequency, genital sores, hematuria and urgency.  Skin: Negative for rash.  Allergic/Immunologic: Negative for immunocompromised state.  Neurological: Negative for dizziness and headaches.      Objective:    Nursing note and vital signs reviewed.    Chad [name] is pleasant to  speak with and sounds in no obvious distress.  Physical exam limited secondary to telehealth visit.  Assessment & Plan:   Problem List Items Addressed This Visit      Cardiovascular and Mediastinum   Essential hypertension    Chad [name] reports his blood pressure remains well controlled with no significant  adverse side effects of hydrochlorothiazide.  No current symptoms concerning for endorgan damage or intracranial hemorrhage.  Encouraged to continue monitoring blood pressure as able and continue current dose of hydrochlorothiazide.      Relevant Medications   hydrochlorothiazide (HYDRODIURIL) 25 MG tablet     Other   HIV disease (HCC) - Primary    Chad [name] continues to have well-controlled HIV disease with good adherence and tolerance to his ART regimen of Biktarvy.  No signs/symptoms of opportunistic infection or progressive HIV.  We reviewed lab work and discussed plan of care.  Continue current dose of Biktarvy.  Plan for follow-up in 4 months or sooner if needed with lab work 1 to 2 weeks prior to appointment.      Relevant Medications   bictegravir-emtricitabine-tenofovir AF (BIKTARVY) 50-200-25 MG TABS tablet   Healthcare maintenance     Discussed importance of safe sexual practice to reduce risk of STI.  Covid vaccines up-to-date per recommendations.  Routine dental care up-to-date per recommendations.      Syphilis    Chad [name]'s current RPR titer 1: 16 is down from 1: 64 treatment level which indicates successful treatment of syphilis.  No current symptoms.  Encouraged safe sexual practices.  Continue to monitor.      Relevant Medications   bictegravir-emtricitabine-tenofovir AF (BIKTARVY) 50-200-25 MG TABS tablet       I have changed [employer]. I am also having him maintain his hydrochlorothiazide.   Meds ordered this encounter  Medications  . bictegravir-emtricitabine-tenofovir AF (BIKTARVY) 50-200-25 MG TABS tablet    Sig: Take 1 tablet by mouth daily.    Dispense:  30 tablet    Refill:  5    Order Specific Question:   Supervising Provider    Answer:   Judyann Munson [[id number]]  . hydrochlorothiazide (HYDRODIURIL) 25 MG tablet    Sig: Take 1 tablet (25 mg total) by mouth daily.    Dispense:  30 tablet    Refill:  5    Order Specific  Question:   Supervising Provider    Answer:   Judyann Munson [phone]     I discussed the assessment and treatment plan with the patient. The patient was provided an opportunity to ask questions and all were answered. The patient agreed with the plan and demonstrated an understanding of the instructions.   The patient was advised to call back or seek an in-person evaluation if the symptoms worsen or if the condition fails to improve as anticipated.   I provided  13  minutes of non-face-to-face time during this encounter.  Follow-up: Return in about 4 months (around 05/17/2021), or if symptoms worsen or fail to improve.   Marcos Eke, MSN, FNP-C Nurse Practitioner [facility] for Infectious Disease [facility] Main number: [phone]

## 2021-01-17 NOTE — Assessment & Plan Note (Signed)
Mr. Mainer reports his blood pressure remains well controlled with no significant adverse side effects of hydrochlorothiazide.  No current symptoms concerning for endorgan damage or intracranial hemorrhage.  Encouraged to continue monitoring blood pressure as able and continue current dose of hydrochlorothiazide.

## 2021-01-17 NOTE — Patient Instructions (Signed)
Nice to speak with you.  Continue to take your [place] daily as prescribed.  Refills of been sent to the pharmacy.  Plan for follow-up in 4 months or sooner if needed with lab work 1 to 2 weeks prior to appointment.  Have a great day and stay safe!

## 2021-01-17 NOTE — Assessment & Plan Note (Signed)
   Discussed importance of safe sexual practice to reduce risk of STI.  Covid vaccines up-to-date per recommendations.  Routine dental care up-to-date per recommendations.

## 2021-01-26 MED FILL — BIKTARVY 50-200-25 MG TABS: 50-200-25 | 30 days supply | Qty: 30 | Fill #1

## 2021-02-22 MED FILL — BIKTARVY 50-200-25 MG TABS: 50-200-25 | 30 days supply | Qty: 30 | Fill #2

## 2021-02-23 ENCOUNTER — Ambulatory Visit: Payer: Self-pay

## 2021-02-23 ENCOUNTER — Other Ambulatory Visit: Payer: Self-pay

## 2021-02-24 ENCOUNTER — Other Ambulatory Visit (HOSPITAL_COMMUNITY): Payer: Self-pay

## 2021-03-03 ENCOUNTER — Other Ambulatory Visit (HOSPITAL_COMMUNITY): Payer: Self-pay

## 2021-03-18 ENCOUNTER — Other Ambulatory Visit (HOSPITAL_COMMUNITY): Payer: Self-pay

## 2021-03-18 MED FILL — Bictegravir-Emtricitabine-Tenofovir AF Tab 50-200-25 MG: ORAL | 30 days supply | Qty: 30 | Fill #0 | Status: AC

## 2021-03-22 ENCOUNTER — Other Ambulatory Visit (HOSPITAL_COMMUNITY): Payer: Self-pay

## 2021-04-04 ENCOUNTER — Other Ambulatory Visit (HOSPITAL_COMMUNITY): Payer: Self-pay

## 2021-04-11 ENCOUNTER — Other Ambulatory Visit (HOSPITAL_COMMUNITY): Payer: Self-pay

## 2021-04-15 ENCOUNTER — Other Ambulatory Visit (HOSPITAL_COMMUNITY): Payer: Self-pay

## 2021-04-15 ENCOUNTER — Other Ambulatory Visit: Payer: Self-pay | Admitting: Family

## 2021-04-15 DIAGNOSIS — B20 Human immunodeficiency virus [HIV] disease: Secondary | ICD-10-CM

## 2021-04-15 MED ORDER — BIKTARVY 50-200-25 MG PO TABS
1.0000 | ORAL_TABLET | Freq: Every day | ORAL | 3 refills | Status: AC
  Filled 2021-04-15: qty 30, 30d supply, fill #0
  Filled 2021-05-03: qty 30, 30d supply, fill #1
  Filled 2021-06-02: qty 30, 30d supply, fill #2
  Filled 2021-07-05: qty 30, 30d supply, fill #3

## 2021-04-18 ENCOUNTER — Other Ambulatory Visit (HOSPITAL_COMMUNITY): Payer: Self-pay

## 2021-04-21 ENCOUNTER — Other Ambulatory Visit (HOSPITAL_COMMUNITY): Payer: Self-pay

## 2021-05-03 ENCOUNTER — Other Ambulatory Visit (HOSPITAL_COMMUNITY): Payer: Self-pay

## 2021-05-09 ENCOUNTER — Other Ambulatory Visit (HOSPITAL_COMMUNITY): Payer: Self-pay

## 2021-05-16 ENCOUNTER — Other Ambulatory Visit (HOSPITAL_COMMUNITY): Payer: Self-pay

## 2021-05-16 ENCOUNTER — Other Ambulatory Visit: Payer: Self-pay | Admitting: Family

## 2021-05-16 DIAGNOSIS — I10 Essential (primary) hypertension: Secondary | ICD-10-CM

## 2021-05-23 ENCOUNTER — Telehealth: Payer: Self-pay

## 2021-05-23 NOTE — Telephone Encounter (Signed)
Patient called, states he was told by [facility] that his hydrochlorothiazide was not approved. RN called [facility], they state the prescription is valid and ready to be picked up. Called patient back, instructed him to call the [facility] in [place] to have his prescription transferred there to be picked up. Patient verbalized understanding and has no further questions.    Sandie Ano, RN

## 2021-05-31 ENCOUNTER — Other Ambulatory Visit (HOSPITAL_COMMUNITY): Payer: Self-pay

## 2021-06-02 ENCOUNTER — Other Ambulatory Visit (HOSPITAL_COMMUNITY): Payer: Self-pay

## 2021-06-06 ENCOUNTER — Ambulatory Visit: Payer: Self-pay | Admitting: Family

## 2021-06-06 ENCOUNTER — Ambulatory Visit: Payer: Self-pay

## 2021-06-09 ENCOUNTER — Other Ambulatory Visit (HOSPITAL_COMMUNITY): Payer: Self-pay

## 2021-07-05 ENCOUNTER — Other Ambulatory Visit (HOSPITAL_COMMUNITY): Payer: Self-pay

## 2021-07-11 ENCOUNTER — Other Ambulatory Visit (HOSPITAL_COMMUNITY): Payer: Self-pay

## 2021-07-15 ENCOUNTER — Ambulatory Visit: Payer: Self-pay

## 2021-07-15 ENCOUNTER — Ambulatory Visit: Payer: Self-pay | Admitting: Family

## 2021-07-18 ENCOUNTER — Other Ambulatory Visit: Payer: Self-pay

## 2021-07-18 DIAGNOSIS — B20 Human immunodeficiency virus [HIV] disease: Secondary | ICD-10-CM

## 2021-07-18 DIAGNOSIS — Z113 Encounter for screening for infections with a predominantly sexual mode of transmission: Secondary | ICD-10-CM

## 2021-07-19 ENCOUNTER — Ambulatory Visit: Payer: Self-pay

## 2021-07-19 ENCOUNTER — Other Ambulatory Visit: Payer: Self-pay

## 2021-07-19 DIAGNOSIS — B20 Human immunodeficiency virus [HIV] disease: Secondary | ICD-10-CM

## 2021-07-19 DIAGNOSIS — Z113 Encounter for screening for infections with a predominantly sexual mode of transmission: Secondary | ICD-10-CM

## 2021-07-20 LAB — URINE CYTOLOGY ANCILLARY ONLY
Chlamydia: NEGATIVE
Comment: NEGATIVE
Comment: NORMAL
Neisseria Gonorrhea: NEGATIVE

## 2021-07-21 LAB — CBC WITH DIFFERENTIAL/PLATELET
Absolute Monocytes: 892 cells/uL (ref 200–950)
Basophils Absolute: 68 cells/uL (ref 0–200)
Basophils Relative: 0.7 %
Eosinophils Absolute: 175 cells/uL (ref 15–500)
Eosinophils Relative: 1.8 %
HCT: 48.5 % (ref 38.5–50.0)
Hemoglobin: 16.4 g/dL (ref 13.2–17.1)
Lymphs Abs: 5422 cells/uL — ABNORMAL HIGH (ref 850–3900)
MCH: 31.5 pg (ref 27.0–33.0)
MCHC: 33.8 g/dL (ref 32.0–36.0)
MCV: 93.1 fL (ref 80.0–100.0)
MPV: 11 fL (ref 7.5–12.5)
Monocytes Relative: 9.2 %
Neutro Abs: 3143 cells/uL (ref 1500–7800)
Neutrophils Relative %: 32.4 %
Platelets: 226 10*3/uL (ref 140–400)
RBC: 5.21 10*6/uL (ref 4.20–5.80)
RDW: 12.7 % (ref 11.0–15.0)
Total Lymphocyte: 55.9 %
WBC: 9.7 10*3/uL (ref 3.8–10.8)

## 2021-07-21 LAB — T-HELPER CELL (CD4) - (RCID CLINIC ONLY)
CD4 % Helper T Cell: 37 % (ref 33–65)
CD4 T Cell Abs: 1842 /uL — ABNORMAL HIGH (ref 400–1790)

## 2021-07-21 LAB — COMPLETE METABOLIC PANEL WITH GFR
AG Ratio: 1.1 (calc) (ref 1.0–2.5)
ALT: 27 U/L (ref 9–46)
AST: 23 U/L (ref 10–40)
Albumin: 4.5 g/dL (ref 3.6–5.1)
Alkaline phosphatase (APISO): 78 U/L (ref 36–130)
BUN: 13 mg/dL (ref 7–25)
CO2: 27 mmol/L (ref 20–32)
Calcium: 9.3 mg/dL (ref 8.6–10.3)
Chloride: 100 mmol/L (ref 98–110)
Creat: 0.82 mg/dL (ref 0.60–1.29)
Globulin: 4 g/dL (calc) — ABNORMAL HIGH (ref 1.9–3.7)
Glucose, Bld: 99 mg/dL (ref 65–99)
Potassium: 3.7 mmol/L (ref 3.5–5.3)
Sodium: 136 mmol/L (ref 135–146)
Total Bilirubin: 0.5 mg/dL (ref 0.2–1.2)
Total Protein: 8.5 g/dL — ABNORMAL HIGH (ref 6.1–8.1)
eGFR: 108 mL/min/{1.73_m2} (ref >=60)

## 2021-07-21 LAB — FLUORESCENT TREPONEMAL AB(FTA)-IGG-BLD: Fluorescent Treponemal ABS: REACTIVE — AB

## 2021-07-21 LAB — RPR TITER: RPR Titer: 1:32 {titer} — ABNORMAL HIGH

## 2021-07-21 LAB — HIV-1 RNA QUANT-NO REFLEX-BLD
HIV 1 RNA Quant: NOT DETECTED Copies/mL
HIV-1 RNA Quant, Log: NOT DETECTED Log cps/mL

## 2021-07-21 LAB — RPR: RPR Ser Ql: REACTIVE — AB

## 2021-08-02 ENCOUNTER — Ambulatory Visit (INDEPENDENT_AMBULATORY_CARE_PROVIDER_SITE_OTHER): Payer: Self-pay | Admitting: Family

## 2021-08-02 ENCOUNTER — Other Ambulatory Visit (HOSPITAL_COMMUNITY): Payer: Self-pay

## 2021-08-02 ENCOUNTER — Encounter: Payer: Self-pay | Admitting: Family

## 2021-08-02 ENCOUNTER — Other Ambulatory Visit: Payer: Self-pay | Admitting: Family

## 2021-08-02 ENCOUNTER — Other Ambulatory Visit: Payer: Self-pay

## 2021-08-02 DIAGNOSIS — Z0000 Encounter for general adult medical examination without abnormal findings: Secondary | ICD-10-CM

## 2021-08-02 DIAGNOSIS — B20 Human immunodeficiency virus [HIV] disease: Secondary | ICD-10-CM

## 2021-08-02 DIAGNOSIS — I10 Essential (primary) hypertension: Secondary | ICD-10-CM

## 2021-08-02 MED ORDER — HYDROCHLOROTHIAZIDE 25 MG PO TABS: 25.0000 mg | ORAL_TABLET | Freq: Every day | ORAL | 2 refills | Status: DC

## 2021-08-02 NOTE — Progress Notes (Signed)
Subjective:    Patient ID: Chad [name], male    DOB: 1971/03/21, 50 y.o.   MRN: [id number]  Chief Complaint  Patient presents with   HIV Positive/AIDS   Brief Narrative:   Chad [name] is a 50 year old African-American gentleman diagnosed with HIV disease in 2004 when being treated for syphilis and did not initiate care until 04/09/2018.  Risk factor for HIV is MSM. CD4 count was 1370 with viral load of 1490.  No history of opportunistic infection.  Enters care at [facility] stage I.  Genotype with subtype B and no significant resistance patterns.  [name] medication regimen of Biktarvy.  Virtual Visit via Telephone/Video Note   I connected with Chad [name] on 08/02/2021 at 0930 by telephone and verified that I am speaking with the correct person using two identifiers.   I discussed the limitations, risks, security and privacy concerns of performing an evaluation and management service by telephone and the availability of in person appointments. I also discussed with the patient that there may be a patient responsible charge related to this service. The patient expressed understanding and agreed to proceed.  Location:  Patient: Home in [place] Provider: Clinic   HPI:  Chad [name] is a 50 y.o. male with HIV disease last seen through telehealth visit on 01/17/2021 with well-controlled virus and good adherence and tolerance to his ART regimen of Biktarvy.  Viral load was undetectable and CD4 count was 1625.  Most recent lab work completed on 07/19/2021 with viral load that remains undetectable and CD4 count of 1842.  Urine was negative for gonorrhea and chlamydia.  RPR increased from 1: 16 up to 1:32 with previous treatment at 1: 64.  Telehealth visit today for routine follow-up.  Chad [name] has been taking his Biktarvy daily as prescribed with no adverse side effects.  Will be establishing care in [place] going forward.  Overall feeling well today with no new concerns/complaints.  Denies fevers, chills, night sweats, headaches, changes in vision, neck pain/stiffness, nausea, diarrhea, vomiting, lesions or rashes.  Chad [name] has no problems obtaining medication from the pharmacy and remains covered through [employer].  Denies feelings of being down, depressed, or hopeless recently.  Drinks approximately 10 drinks of alcohol per week with no recreational or illicit drug use and approximately 1 to 2 cigarettes/day on average.   Allergies  Allergen Reactions   Penicillins Other (See Comments)    Other Reaction: mild hives in remote past      Outpatient Medications Prior to Visit  Medication Sig Dispense Refill   bictegravir-emtricitabine-tenofovir AF (BIKTARVY) 50-200-25 MG TABS tablet TAKE 1 TABLET BY MOUTH DAILY. 30 tablet 3   hydrochlorothiazide (HYDRODIURIL) 25 MG tablet Take 1 tablet (25 mg total) by mouth daily. 30 tablet 5   bictegravir-emtricitabine-tenofovir AF (BIKTARVY) 50-200-25 MG TABS tablet Take 1 tablet by mouth daily. (Patient not taking: Reported on 08/02/2021) 30 tablet 5   No facility-administered medications prior to visit.     Past Medical History:  Diagnosis Date   HIV (human immunodeficiency virus infection) (HCC)    Tuberculosis      Past Surgical History:  Procedure Laterality Date   HERNIA REPAIR     TONSILLECTOMY         Review of Systems  Constitutional:  Negative for appetite change, chills, fatigue, fever and unexpected weight change.  Eyes:  Negative for visual disturbance.  Respiratory:  Negative for cough, chest tightness, shortness of breath and wheezing.   Cardiovascular:  Negative for  chest pain and leg swelling.  Gastrointestinal:  Negative for abdominal pain, constipation, diarrhea, nausea and vomiting.  Genitourinary:  Negative for dysuria, flank pain, frequency, genital sores, hematuria and urgency.  Skin:  Negative for rash.  Allergic/Immunologic: Negative for immunocompromised state.  Neurological:   Negative for dizziness and headaches.     Objective:    Nursing note and vital signs reviewed.    Chad [name] is pleasant to speak with and is in no apparent distress.  Sounds to be doing well.  Physical exam limited secondary to telehealth visit.  Assessment & Plan:   Problem List Items Addressed This Visit       Cardiovascular and Mediastinum   Essential hypertension    Chad [name] appears to be adequately maintained with hydrochlorothiazide with no adverse side effects or hypotension.  Continue to monitor blood pressure at home as able.  Recommend establishing care with new provider to recheck blood pressure periodically.  Continue current dose of hydrochlorothiazide.      Relevant Medications   hydrochlorothiazide (HYDRODIURIL) 25 MG tablet     Other   HIV disease [facility])    Chad [name] continues to have well-controlled virus with good adherence and tolerance to his ART regimen of Biktarvy.  No signs/symptoms of opportunistic infection.  We reviewed lab work and discussed plan of care.  Continue current dose of Biktarvy.  Will be establishing care in [place].  Records request has been sent.  Happy to see him back at any time and plan for follow-up as needed.      Healthcare maintenance    Discussed importance of safe sexual practice to reduce risk of STI.          I am having Chad [name] maintain his Biktarvy, [name], and hydrochlorothiazide.   Meds ordered this encounter  Medications   hydrochlorothiazide (HYDRODIURIL) 25 MG tablet    Sig: Take 1 tablet (25 mg total) by mouth daily.    Dispense:  30 tablet    Refill:  2    Order Specific Question:   Supervising Provider    Answer:   Judyann Munson [phone]     I discussed the assessment and treatment plan with the patient. The patient was provided an opportunity to ask questions and all were answered. The patient agreed with the plan and demonstrated an understanding of the instructions.   The patient was  advised to call back or seek an in-person evaluation if the symptoms worsen or if the condition fails to improve as anticipated.   I provided 11  minutes of non-face-to-face time during this encounter.  Follow-up: Transferring Care - as needed   Marcos Eke, MSN, FNP-C Nurse Practitioner [facility] for Infectious Disease [facility] Main number: [phone]

## 2021-08-02 NOTE — Assessment & Plan Note (Signed)
Chad [name] appears to be adequately maintained with hydrochlorothiazide with no adverse side effects or hypotension.  Continue to monitor blood pressure at home as able.  Recommend establishing care with new provider to recheck blood pressure periodically.  Continue current dose of hydrochlorothiazide.

## 2021-08-02 NOTE — Assessment & Plan Note (Signed)
Chad [name] continues to have well-controlled virus with good adherence and tolerance to his ART regimen of Biktarvy.  No signs/symptoms of opportunistic infection.  We reviewed lab work and discussed plan of care.  Continue current dose of Biktarvy.  Will be establishing care in [place].  Records request has been sent.  Happy to see him back at any time and plan for follow-up as needed.

## 2021-08-02 NOTE — Assessment & Plan Note (Signed)
   Discussed importance of safe sexual practice to reduce risk of STI. 

## 2021-08-12 ENCOUNTER — Encounter: Payer: Self-pay | Admitting: Family

## 2021-11-19 ENCOUNTER — Other Ambulatory Visit: Payer: Self-pay | Admitting: Family

## 2021-11-19 DIAGNOSIS — I10 Essential (primary) hypertension: Secondary | ICD-10-CM

## 2021-11-22 NOTE — Telephone Encounter (Signed)
Okay to refill? No PCP listed. Thanks

## 2022-01-18 ENCOUNTER — Other Ambulatory Visit: Payer: Self-pay | Admitting: Family

## 2022-01-18 DIAGNOSIS — I10 Essential (primary) hypertension: Secondary | ICD-10-CM
